# Patient Record
Sex: Male | Born: 1995
Health system: Southern US, Community
[De-identification: ages and names within clinical notes are randomized; demographics above are authoritative.]

## PROBLEM LIST (undated history)

## (undated) DIAGNOSIS — Z973 Presence of spectacles and contact lenses: Secondary | ICD-10-CM

## (undated) DIAGNOSIS — S46119A Strain of muscle, fascia and tendon of long head of biceps, unspecified arm, initial encounter: Secondary | ICD-10-CM

## (undated) DIAGNOSIS — J302 Other seasonal allergic rhinitis: Secondary | ICD-10-CM

## (undated) DIAGNOSIS — Z8489 Family history of other specified conditions: Secondary | ICD-10-CM

## (undated) DIAGNOSIS — R112 Nausea with vomiting, unspecified: Secondary | ICD-10-CM

## (undated) DIAGNOSIS — Z9889 Other specified postprocedural states: Secondary | ICD-10-CM

## (undated) HISTORY — DX: Other seasonal allergic rhinitis: J30.2

## (undated) HISTORY — PX: ANTERIOR CRUCIATE LIGAMENT REPAIR: SHX115

---
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
## 2015-05-09 ENCOUNTER — Ambulatory Visit (INDEPENDENT_AMBULATORY_CARE_PROVIDER_SITE_OTHER): Payer: BLUE CROSS/BLUE SHIELD

## 2015-05-09 ENCOUNTER — Ambulatory Visit (INDEPENDENT_AMBULATORY_CARE_PROVIDER_SITE_OTHER): Payer: BLUE CROSS/BLUE SHIELD | Admitting: Physician Assistant

## 2015-05-09 VITALS — BP 106/68 | HR 64 | Temp 97.6°F | Resp 16 | Ht 68.0 in | Wt 165.0 lb

## 2015-05-09 DIAGNOSIS — J209 Acute bronchitis, unspecified: Secondary | ICD-10-CM

## 2015-05-09 DIAGNOSIS — R0989 Other specified symptoms and signs involving the circulatory and respiratory systems: Secondary | ICD-10-CM

## 2015-05-09 MED ORDER — BENZONATATE 100 MG PO CAPS: 100.0000 mg | ORAL_CAPSULE | Freq: Three times a day (TID) | ORAL | Status: DC | PRN

## 2015-05-09 MED ORDER — ALBUTEROL SULFATE HFA 108 (90 BASE) MCG/ACT IN AERS: 2.0000 | INHALATION_SPRAY | RESPIRATORY_TRACT | Status: DC | PRN

## 2015-05-09 MED ORDER — AZITHROMYCIN 250 MG PO TABS: ORAL_TABLET | ORAL | Status: DC

## 2015-05-09 MED ORDER — HYDROCOD POLST-CPM POLST ER 10-8 MG/5ML PO SUER
5.0000 mL | Freq: Every evening | ORAL | Status: DC | PRN
Start: 2015-05-09 — End: 2016-07-05

## 2015-05-09 NOTE — Patient Instructions (Signed)

## 2015-05-09 NOTE — Progress Notes (Signed)
   Subjective:    Patient ID: Rick Dixon, male    DOB: 11/03/1995, 19 y.o.   MRN: [id number]  HPI Patient presents with father for cough that has been present for the past 2 weeks. Cough is productive and has gotten bad as of yesterday. Could not sleep last night and is coughing to the point of gagging and causing some chest pain. Additionally endorses nausea, decreased appetite, and sore throat. Denies wheezing, fever, vomiting, SOB, HA, rhinorrhea, and congestion. Multiple sick contacts as he lives in a dorm. NKDA.   Review of Systems As noted above.    Objective:   Physical Exam  Constitutional: He is oriented to person, place, and time. He appears well-developed and well-nourished. No distress.  Blood pressure 106/68, pulse 64, temperature 97.6 F (36.4 C), resp. rate 16, height 5\' 8"  (1.727 m), weight 165 lb (74.844 kg), SpO2 99 %.  HENT:  Head: Normocephalic and atraumatic.  Right Ear: Tympanic membrane, external ear and ear canal normal.  Left Ear: Tympanic membrane and ear canal normal.  Nose: Rhinorrhea present. No mucosal edema. Right sinus exhibits no maxillary sinus tenderness and no frontal sinus tenderness. Left sinus exhibits no maxillary sinus tenderness and no frontal sinus tenderness.  Mouth/Throat: Uvula is midline and mucous membranes are normal. Posterior oropharyngeal erythema present. No oropharyngeal exudate or posterior oropharyngeal edema.  Eyes: Conjunctivae are normal. Pupils are equal, round, and reactive to light. Right eye exhibits no discharge. Left eye exhibits no discharge. No scleral icterus.  Neck: Normal range of motion. Neck supple. No thyromegaly present.  Cardiovascular: Normal rate, regular rhythm and normal heart sounds.  Exam reveals no gallop and no friction rub.   No murmur heard. Pulmonary/Chest: Effort normal and breath sounds normal. No respiratory distress. He has no wheezes. He has no rales. He exhibits no tenderness.  Abdominal: Soft.  Bowel sounds are normal. He exhibits no distension and no mass. There is no tenderness. There is no rebound and no guarding.  Lymphadenopathy:    He has no cervical adenopathy.  Neurological: He is alert and oriented to person, place, and time.  Skin: Skin is warm and dry. No rash noted. He is not diaphoretic. No erythema. No pallor.   [facility] reading (PRIMARY) by  Dr. Neva SeatGreene. Increased hilar markings.     Assessment & Plan:  1. Rhonchi - DG Chest 2 View; Future  2. Acute bronchitis, unspecified organism Increase water intake. Plenty of rest.  - albuterol (PROVENTIL HFA;VENTOLIN HFA) 108 (90 BASE) MCG/ACT inhaler; Inhale 2 puffs into the lungs every 4 (four) hours as needed for wheezing or shortness of breath (cough, shortness of breath or wheezing.).  Dispense: 1 Inhaler; Refill: 1 - benzonatate (TESSALON) 100 MG capsule; Take 1-2 capsules (100-200 mg total) by mouth 3 (three) times daily as needed for cough.  Dispense: 40 capsule; Refill: 0 - chlorpheniramine-HYDROcodone (TUSSIONEX PENNKINETIC ER) 10-8 MG/5ML SUER; Take 5 mLs by mouth at bedtime as needed for cough.  Dispense: 75 mL; Refill: 0 - azithromycin (ZITHROMAX) 250 MG tablet; Take 2 tabs PO x 1 dose, then 1 tab PO QD x 4 days  Dispense: 6 tablet; Refill: 0   Eryca Bolte PA-C  [facility] 05/09/2015 2:47 PM

## 2016-02-08 DIAGNOSIS — H1013 Acute atopic conjunctivitis, bilateral: Secondary | ICD-10-CM | POA: Diagnosis not present

## 2016-04-29 DIAGNOSIS — H52223 Regular astigmatism, bilateral: Secondary | ICD-10-CM | POA: Diagnosis not present

## 2016-04-29 DIAGNOSIS — H5213 Myopia, bilateral: Secondary | ICD-10-CM | POA: Diagnosis not present

## 2016-07-04 DIAGNOSIS — J029 Acute pharyngitis, unspecified: Secondary | ICD-10-CM | POA: Diagnosis not present

## 2016-07-04 DIAGNOSIS — J039 Acute tonsillitis, unspecified: Secondary | ICD-10-CM | POA: Diagnosis not present

## 2016-07-05 ENCOUNTER — Ambulatory Visit (INDEPENDENT_AMBULATORY_CARE_PROVIDER_SITE_OTHER): Payer: BLUE CROSS/BLUE SHIELD | Admitting: Family Medicine

## 2016-07-05 VITALS — BP 100/66 | HR 122 | Temp 101.4°F | Resp 18 | Ht 68.0 in | Wt 161.0 lb

## 2016-07-05 DIAGNOSIS — R5081 Fever presenting with conditions classified elsewhere: Secondary | ICD-10-CM | POA: Diagnosis not present

## 2016-07-05 DIAGNOSIS — J029 Acute pharyngitis, unspecified: Secondary | ICD-10-CM

## 2016-07-05 DIAGNOSIS — R1012 Left upper quadrant pain: Secondary | ICD-10-CM | POA: Diagnosis not present

## 2016-07-05 DIAGNOSIS — R1011 Right upper quadrant pain: Secondary | ICD-10-CM

## 2016-07-05 DIAGNOSIS — J039 Acute tonsillitis, unspecified: Secondary | ICD-10-CM | POA: Diagnosis not present

## 2016-07-05 DIAGNOSIS — Z114 Encounter for screening for human immunodeficiency virus [HIV]: Secondary | ICD-10-CM

## 2016-07-05 DIAGNOSIS — E869 Volume depletion, unspecified: Secondary | ICD-10-CM

## 2016-07-05 DIAGNOSIS — B370 Candidal stomatitis: Secondary | ICD-10-CM

## 2016-07-05 DIAGNOSIS — B2799 Infectious mononucleosis, unspecified with other complication: Secondary | ICD-10-CM | POA: Diagnosis not present

## 2016-07-05 DIAGNOSIS — R638 Other symptoms and signs concerning food and fluid intake: Secondary | ICD-10-CM | POA: Diagnosis not present

## 2016-07-05 LAB — POCT URINALYSIS DIP (MANUAL ENTRY)
Glucose, UA: NEGATIVE
Leukocytes, UA: NEGATIVE
NITRITE UA: NEGATIVE
PH UA: 6
RBC UA: NEGATIVE
SPEC GRAV UA: 1.02
Urobilinogen, UA: 4

## 2016-07-05 LAB — POCT CBC
Granulocyte percent: 53.2 %G (ref 37–80)
HEMATOCRIT: 42.7 % — AB (ref 43.5–53.7)
HEMOGLOBIN: 15.1 g/dL (ref 14.1–18.1)
LYMPH, POC: 4.2 — AB (ref 0.6–3.4)
MCH: 29.5 pg (ref 27–31.2)
MCHC: 35.2 g/dL (ref 31.8–35.4)
MCV: 83.7 fL (ref 80–97)
MID (cbc): 1.1 — AB (ref 0–0.9)
MPV: 7.9 fL (ref 0–99.8)
POC GRANULOCYTE: 6 (ref 2–6.9)
POC LYMPH PERCENT: 37.1 %L (ref 10–50)
POC MID %: 9.7 %M (ref 0–12)
Platelet Count, POC: 165 10*3/uL (ref 142–424)
RBC: 5.1 M/uL (ref 4.69–6.13)
RDW, POC: 12.4 %
WBC: 11.3 10*3/uL — AB (ref 4.6–10.2)

## 2016-07-05 LAB — POCT RAPID STREP A (OFFICE): RAPID STREP A SCREEN: NEGATIVE

## 2016-07-05 LAB — POCT SKIN KOH: Skin KOH, POC: NEGATIVE

## 2016-07-05 MED ORDER — CLINDAMYCIN HCL 300 MG PO CAPS: 300.0000 mg | ORAL_CAPSULE | Freq: Three times a day (TID) | ORAL | 0 refills | Status: DC

## 2016-07-05 MED ORDER — HYDROCODONE-ACETAMINOPHEN 5-325 MG PO TABS: 1.0000 | ORAL_TABLET | Freq: Four times a day (QID) | ORAL | 0 refills | Status: DC | PRN

## 2016-07-05 MED ORDER — MAGIC MOUTHWASH W/LIDOCAINE: 5.0000 mL | Freq: Four times a day (QID) | ORAL | 0 refills | Status: DC | PRN

## 2016-07-05 MED ORDER — PREDNISONE 20 MG PO TABS: 40.0000 mg | ORAL_TABLET | Freq: Every day | ORAL | 0 refills | Status: DC

## 2016-07-05 MED ORDER — IBUPROFEN 200 MG PO TABS
400.0000 mg | ORAL_TABLET | Freq: Once | ORAL | Status: AC
  Administered 2016-07-05: 400 mg via ORAL

## 2016-07-05 MED ORDER — CLOTRIMAZOLE 10 MG MT TROC: 10.0000 mg | Freq: Every day | OROMUCOSAL | 0 refills | Status: DC

## 2016-07-05 NOTE — Progress Notes (Signed)
Iv 1000ccns started in right forearm 20 g  IV D/C 1320 W/O INCIDENT

## 2016-07-05 NOTE — Progress Notes (Signed)
Subjective:  By signing my name below, I, Stann Ore, attest that this documentation has been prepared under the direction and in the presence of Meredith Staggers, MD. Electronically Signed: Stann Ore, Scribe. 07/05/2016 , 10:33 AM .  Patient was seen in Room 7 .   Patient ID: Rick Dixon, male    DOB: 07-31-1995, 20 y.o.   MRN: [id number] Chief Complaint  Patient presents with  . Sore Throat    poss mono   HPI Rick Dixon is a 20 y.o. male  Patient was diagnosed for mono about 10 days~14 days ago, while at school. Initial testing was negative, but longer exam was positive. He had symptoms including feeling really fatigue, muscle aches, night sweat and abdominal pain. He had EBV VCA IGM that was positive. He had CBC showing platelets that were down to 99 but increased to 113 around Dec 10th. He had increased lymphocytes based on email from provider at school.   His symptoms started to improve about 3 days ago. But, then he noticed sore throat with worsening abdominal pain. He was seen at [facility] and had strep test done, which was negative. He was diagnosed with tonsillitis and instructed to follow up with a physician. He hasn't been able to eat or drink much in the past 2~3 days. He describes his sore throat like "it's closing up on him". He also noticed white patches over his lips and teeth this morning.   While at school, he was instructed to start a strict routine of ibuprofen and advil about 3 pills every 4~6 hours. He took tylenol last night and zpak 2nd dose today. He notes abdominal pain more last night. He denies any recent injury to his spleen. He denies lightheadedness or dizziness.   He attends [employer].   Spoke with patient with parent outside of room He denies STI testing done in the past. He's been sexually active with 1 partner and used condom for contraception.   Over 40 minutes of face-to-face care, with repeat exam.   Patient Active  Problem List   Diagnosis Date Noted  . Fracture of rib of right side with delayed healing 07/31/2013   Past Medical History:  Diagnosis Date  . History of seasonal allergies    History reviewed. No pertinent surgical history. No Known Allergies Prior to Admission medications   Medication Sig Start Date End Date Taking? Authorizing Provider  acetaminophen (TYLENOL) 325 MG tablet Take 650 mg by mouth every 6 (six) hours as needed.   Yes Historical Provider, MD  azithromycin (ZITHROMAX) 250 MG tablet Take 2 tabs PO x 1 dose, then 1 tab PO QD x 4 days 05/09/15  Yes Tishira R Brewington, PA-C  ibuprofen (ADVIL,MOTRIN) 200 MG tablet Take 200 mg by mouth every 6 (six) hours as needed.   Yes Historical Provider, MD   Social History   Social History  . Marital status: Single    Spouse name: N/A  . Number of children: N/A  . Years of education: N/A   Occupational History  . Not on file.   Social History Main Topics  . Smoking status: Never Smoker  . Smokeless tobacco: Never Used  . Alcohol use No  . Drug use: No  . Sexual activity: No   Other Topics Concern  . Not on file   Social History Narrative  . No narrative on file   Review of Systems  Constitutional: Positive for appetite change, diaphoresis, fatigue and fever. Negative for chills  and unexpected weight change.  HENT: Positive for sore throat, trouble swallowing and voice change.   Eyes: Negative for visual disturbance.  Respiratory: Negative for cough, chest tightness and shortness of breath.   Cardiovascular: Negative for chest pain, palpitations and leg swelling.  Gastrointestinal: Positive for abdominal pain. Negative for blood in stool.  Neurological: Negative for dizziness, light-headedness and headaches.       Objective:   Physical Exam  Constitutional: He is oriented to person, place, and time. He appears well-developed and well-nourished. No distress.  HENT:  Head: Normocephalic and atraumatic.    Mouth/Throat: Mucous membranes are normal.  White patches over top and bottom lip, as well as coating of tongue; 2+ tonsils with gray white exudate over right tonsil, prominence on pre tonsillar on right with possible left sided shift Right ear canal has a few black specks at external canal, no erythema or edema; TM pearly gray Left ear canal is clear  Eyes: EOM are normal. Pupils are equal, round, and reactive to light.  Neck: Neck supple.  Cardiovascular: Regular rhythm.  Tachycardia present.   Pulmonary/Chest: Effort normal. No respiratory distress.  Abdominal: Bowel sounds are normal. There is tenderness in the right upper quadrant and left upper quadrant. There is no rebound and no guarding.  Slight discomfort with heel-jar on left  Genitourinary:  Genitourinary Comments: Tender along inguinal nodes, but not significantly enlarged  Musculoskeletal: Normal range of motion.  Lymphadenopathy:       Head (left side): Occipital adenopathy present.    He has no axillary adenopathy.       Right: No epitrochlear adenopathy present.       Left: No epitrochlear adenopathy present.  Multiple enlarged AC nodes  Neurological: He is alert and oriented to person, place, and time.  Skin: Skin is warm and dry. No rash noted.  Normal turgor  Psychiatric: He has a normal mood and affect. His behavior is normal.  Nursing note and vitals reviewed.   Vitals:   07/05/16 0950  BP: 100/66  Pulse: (!) 122  Resp: 18  Temp: (!) 101.4 F (38.6 C)  TempSrc: Oral  SpO2: 99%  Weight: 161 lb (73 kg)  Height: 5\' 8"  (1.727 m)   Results for orders placed or performed in visit on 07/05/16  POCT CBC  Result Value Ref Range   WBC 11.3 (A) 4.6 - 10.2 K/uL   Lymph, poc 4.2 (A) 0.6 - 3.4   POC LYMPH PERCENT 37.1 10 - 50 %L   MID (cbc) 1.1 (A) 0 - 0.9   POC MID % 9.7 0 - 12 %M   POC Granulocyte 6.0 2 - 6.9   Granulocyte percent 53.2 37 - 80 %G   RBC 5.10 4.69 - 6.13 M/uL   Hemoglobin 15.1 14.1 - 18.1  g/dL   HCT, POC 16.1 (A) 09.6 - 53.7 %   MCV 83.7 80 - 97 fL   MCH, POC 29.5 27 - 31.2 pg   MCHC 35.2 31.8 - 35.4 g/dL   RDW, POC 04.5 %   Platelet Count, POC 165 142 - 424 K/uL   MPV 7.9 0 - 99.8 fL  POCT rapid strep A  Result Value Ref Range   Rapid Strep A Screen Negative Negative  POCT urinalysis dipstick  Result Value Ref Range   Color, UA yellow yellow   Clarity, UA clear clear   Glucose, UA negative negative   Bilirubin, UA small (A) negative   Ketones, POC UA >= (160) (  A) negative   Spec Grav, UA 1.020    Blood, UA negative negative   pH, UA 6.0    Protein Ur, POC trace (A) negative   Urobilinogen, UA 4.0    Nitrite, UA Negative Negative   Leukocytes, UA Negative Negative   Orthostatic VS for the past 24 hrs (Last 3 readings):  BP- Lying Pulse- Lying BP- Sitting Pulse- Sitting BP- Standing at 0 minutes Pulse- Standing at 0 minutes  07/05/16 1131 97/61 99 110/69 - 94/60 120  07/05/16 1046 129/75 106 123/64 104 94/59 125   [11:44 AM] After 1 L IV fluid, repeat orthostatics obtained. Still tachycardic. Has not had urine output since IV started. Second liter of normal saline started.     Assessment & Plan:    Rick Dixon is a 20 y.o. male Infectious mononucleosis, with other complication, infectious mononucleosis due to unspecified organism - Plan: predniSONE (DELTASONE) 20 MG tablet Volume depletion - Plan: Orthostatic vital signs, POCT urinalysis dipstick LUQ abdominal pain - Plan: POCT CBC RUQ abdominal pain - Plan: POCT CBC, Comprehensive metabolic panel Sore throat - Plan: POCT rapid strep A, Culture, Group A Strep Fever in other diseases - Plan: ibuprofen (ADVIL,MOTRIN) tablet 400 mg Acute tonsillitis, unspecified etiology - Plan: clindamycin (CLEOCIN) 300 MG capsule Decreased oral intake  - Infectious mononucleosis with possible secondary tonsillitis versus persistent tonsillar exudate and inflammation from mono. No definitive signs of peritonsillar abscess,  and second M.D. exam was performed. Changed from azithromycin to clindamycin for coverage of possible secondary tonsillitis without true PTA at this time. Follow-up in 24 hours.  -2 L normal saline given with improvement of volume depletion. Oral rehydration discussed with frequent sips of fluids especially water at home.  -Prednisone 40 mg daily for 5 days to help with tonsillar swelling to assist in oral intake.  -Hydrocodone if needed for throat pain, Magic mouthwash as needed   -Recheck 24 hours.  - Initial concern with abdominal pain and splenic rupture risk and mono. Pain improved after IV fluids, nontender on exam at end of visit. Decided to hold on imaging at this time.  Thrush - Plan: HIV antibody, POCT Skin KOH Screening for HIV (human immunodeficiency virus) - Plan: HIV antibody  -Scraping negative, but clinically appears to have thrush on tongue, and possibly on lips.  -Start Mycelex for thrush, check HIV antibody although no known risk factors.   Overnight ER precautions given.       Meds ordered this encounter  Medications  . ibuprofen (ADVIL,MOTRIN) 200 MG tablet    Sig: Take 200 mg by mouth every 6 (six) hours as needed.  Marland Kitchen. acetaminophen (TYLENOL) 325 MG tablet    Sig: Take 650 mg by mouth every 6 (six) hours as needed.  Marland Kitchen. ibuprofen (ADVIL,MOTRIN) tablet 400 mg  . clindamycin (CLEOCIN) 300 MG capsule    Sig: Take 1 capsule (300 mg total) by mouth 3 (three) times daily.    Dispense:  30 capsule    Refill:  0  . predniSONE (DELTASONE) 20 MG tablet    Sig: Take 2 tablets (40 mg total) by mouth daily with breakfast.    Dispense:  10 tablet    Refill:  0  . clotrimazole (MYCELEX) 10 MG troche    Sig: Take 1 tablet (10 mg total) by mouth 5 (five) times daily.    Dispense:  70 tablet    Refill:  0  . HYDROcodone-acetaminophen (NORCO/VICODIN) 5-325 MG tablet    Sig: Take 1 tablet  by mouth every 6 (six) hours as needed for moderate pain.    Dispense:  15 tablet     Refill:  0  . magic mouthwash w/lidocaine SOLN    Sig: Take 5 mLs by mouth 4 (four) times daily as needed for mouth pain.    Dispense:  120 mL    Refill:  0    Ok to substitute ingredients per pharmacy usual "magic mouthwash" prep.   Patient Instructions    Stop azithromycin and start clindamycin. Start prednisone 2 pills per day for the next 5 days. If any return of abdominal pain or other worsening symptoms tonight, proceed to the emergency room. Otherwise follow-up with me tomorrow morning for recheck. Small sips of fluids frequently tonight.  Return to the clinic or go to the nearest emergency room if any of your symptoms worsen or new symptoms occur.   Infectious Mononucleosis Infectious mononucleosis is a viral infection. It is often referred to as "mono." It causes symptoms that affect various areas of the body, including the throat, upper air passages, and lymph glands. The liver or spleen may also be affected. The virus spreads from person to person (is contagious) through close contact. The illness is usually not serious, and it typically goes away in 2-4 weeks without treatment. In rare cases, symptoms can be more severe and last longer, sometimes up to several months. What are the causes? This condition is commonly caused by the Epstein-Barr virus. This virus spreads through:  Contact with an infected person's saliva or other bodily fluids, often through:  Kissing.  Sexual contact.  Coughing.  Sneezing.  Sharing utensils or drinking glasses that were recently used by an infected person.  Blood transfusions.  Organ transplantation. What increases the risk? You are more likely to develop this condition if:  You are 9615-20 years old. What are the signs or symptoms? Symptoms of this condition usually appear 4-6 weeks after infection. Symptoms may develop slowly and occur at different times. Common symptoms include:  Sore throat.  Headache.  Extreme  fatigue.  Muscle aches.  Swollen glands.  Fever.  Poor appetite.  Rash. Other symptoms include:  Enlarged liver or spleen.  Nausea.  Abdominal pain. How is this diagnosed? This condition may be diagnosed based on:  Your medical history.  Your symptoms.  A physical exam.  Blood tests to confirm the diagnosis. How is this treated? There is no cure for this condition. Infectious mononucleosis usually goes away on its own with time. Treatment can help relieve symptoms and may include:  Taking medicines to relieve pain and fever.  Drinking plenty of fluids.  Getting a lot of rest.  Medicine (corticosteroids)to reduce swelling. This may be used if swelling in the throat causes breathing or swallowing problems. In some severe cases, treatment has to be given in a hospital. Follow these instructions at home: Medicines  Take over-the-counter and prescription medicines only as told by your health care provider.  Do not take ampicillin or amoxicillin. This may cause a rash.  If you are under 18, do not take aspirin because of the association with Reye syndrome. Activity  Rest as needed.  Do not participate in any of the following activities until your health care provider approves:  Contact sports. You may need to wait at least a month before participating in sports.  Exercise that requires a lot of energy.  Heavy lifting.  Gradually resume your normal activities after your fever is gone, or when your health care  provider tells you that you can. Be sure to rest when you get tired. General instructions  Avoid kissing or sharing utensils or drinking glasses until your health care provider tells you that you are no longer contagious.  Drink enough fluid to keep your urine clear or pale yellow.  Do not drink alcohol.  If you have a sore throat:  Gargle with a salt-water mixture 3-4 times a day or as needed. To make a salt-water mixture, completely dissolve -1  tsp of salt in 1 cup of warm water.  Eat soft foods. Cold foods such as ice cream or frozen ice pops can soothe a sore throat.  Try sucking on hard candy.  Wash your hands often with soap and water to avoid spreading the infection. If soap and water are not available, use hand sanitizer. How is this prevented?  Avoid contact with people who are infected with mononucleosis. An infected person may not always appear ill, but he or she can still spread the virus.  Avoid sharing utensils, drinking glasses, or toothbrushes.  Wash your hands frequently with soap and water. If soap and water are not available, use hand sanitizer.  Use the inside of your elbow to cover your mouth when coughing or sneezing. Contact a health care provider if:  Your fever is not gone after 10 days.  You have swollen lymph nodes that are not back to normal after 4 weeks.  Your activity level is not back to normal after 2 months.  Your skin or the white parts of your eyes turn yellow (jaundice).  You have constipation. This may mean that you have:  Fewer bowel movements in a week than normal.  Difficulty having a bowel movement.  Stools that are dry, hard, or larger than normal. Get help right away if:  You have severe pain in your abdomen or shoulder.  You are drooling.  You have trouble swallowing.  You have trouble breathing.  You develop a stiff neck.  You develop a severe headache.  You cannot stop vomiting.  You have jerky movements that you cannot control (seizures).  You are confused.  You have trouble with balance.  Your nose or gums begin to bleed.  You have signs of dehydration. These may include:  Weakness.  Sunken eyes.  Pale skin.  Dry mouth.  Rapid breathing or pulse. Summary  Infectious mononucleosis, or "mono," is an infection caused by the Epstein-Barr virus.  The virus that causes this condition is spread through bodily fluids. The virus is most commonly  spread by kissing or sharing drinks or utensils with an infected person.  You are more likely to develop this infection if you are 59-22 years old.  Symptoms of this condition can include sore throat, headache, fever, swollen glands, muscle aches, extreme fatigue, and swollen liver or spleen.  There is no cure for this condition. The goal of treatment is to help relieve symptoms. Treatment may include drinking plenty of water, getting a lot of rest, and taking pain relievers. This information is not intended to replace advice given to you by your health care provider. Make sure you discuss any questions you have with your health care provider. Document Released: 07/01/2000 Document Revised: 03/22/2016 Document Reviewed: 03/22/2016 Elsevier Interactive Patient Education  2017 [employer].     IF you received an x-ray today, you will receive an invoice from [facility]. Please contact [facility] at [phone] with questions or concerns regarding your invoice.   IF you received  labwork today, you will receive an invoice from [employer]. Please contact [facility] at [phone] with questions or concerns regarding your invoice.   Our billing staff will not be able to assist you with questions regarding bills from these companies.  You will be contacted with the lab results as soon as they are available. The fastest way to get your results is to activate your My Chart account. Instructions are located on the last page of this paperwork. If you have not heard from [place] regarding the results in 2 weeks, please contact this office.       I personally performed the services described in this documentation, which was scribed in my presence. The recorded information has been reviewed and considered, and addended by me as needed.   Signed,   Meredith Staggers, MD [facility].  07/06/16 9:07 AM

## 2016-07-05 NOTE — Patient Instructions (Addendum)
Stop azithromycin and start clindamycin. Start prednisone 2 pills per day for the next 5 days. If any return of abdominal pain or other worsening symptoms tonight, proceed to the emergency room. Otherwise follow-up with me tomorrow morning for recheck. Small sips of fluids frequently tonight.  Return to the clinic or go to the nearest emergency room if any of your symptoms worsen or new symptoms occur.   Infectious Mononucleosis Infectious mononucleosis is a viral infection. It is often referred to as "mono." It causes symptoms that affect various areas of the body, including the throat, upper air passages, and lymph glands. The liver or spleen may also be affected. The virus spreads from person to person (is contagious) through close contact. The illness is usually not serious, and it typically goes away in 2-4 weeks without treatment. In rare cases, symptoms can be more severe and last longer, sometimes up to several months. What are the causes? This condition is commonly caused by the Epstein-Barr virus. This virus spreads through:  Contact with an infected person's saliva or other bodily fluids, often through:  Kissing.  Sexual contact.  Coughing.  Sneezing.  Sharing utensils or drinking glasses that were recently used by an infected person.  Blood transfusions.  Organ transplantation. What increases the risk? You are more likely to develop this condition if:  You are 2715-20 years old. What are the signs or symptoms? Symptoms of this condition usually appear 4-6 weeks after infection. Symptoms may develop slowly and occur at different times. Common symptoms include:  Sore throat.  Headache.  Extreme fatigue.  Muscle aches.  Swollen glands.  Fever.  Poor appetite.  Rash. Other symptoms include:  Enlarged liver or spleen.  Nausea.  Abdominal pain. How is this diagnosed? This condition may be diagnosed based on:  Your medical history.  Your symptoms.  A  physical exam.  Blood tests to confirm the diagnosis. How is this treated? There is no cure for this condition. Infectious mononucleosis usually goes away on its own with time. Treatment can help relieve symptoms and may include:  Taking medicines to relieve pain and fever.  Drinking plenty of fluids.  Getting a lot of rest.  Medicine (corticosteroids)to reduce swelling. This may be used if swelling in the throat causes breathing or swallowing problems. In some severe cases, treatment has to be given in a hospital. Follow these instructions at home: Medicines  Take over-the-counter and prescription medicines only as told by your health care provider.  Do not take ampicillin or amoxicillin. This may cause a rash.  If you are under 18, do not take aspirin because of the association with Reye syndrome. Activity  Rest as needed.  Do not participate in any of the following activities until your health care provider approves:  Contact sports. You may need to wait at least a month before participating in sports.  Exercise that requires a lot of energy.  Heavy lifting.  Gradually resume your normal activities after your fever is gone, or when your health care provider tells you that you can. Be sure to rest when you get tired. General instructions  Avoid kissing or sharing utensils or drinking glasses until your health care provider tells you that you are no longer contagious.  Drink enough fluid to keep your urine clear or pale yellow.  Do not drink alcohol.  If you have a sore throat:  Gargle with a salt-water mixture 3-4 times a day or as needed. To make a salt-water mixture, completely dissolve -  1 tsp of salt in 1 cup of warm water.  Eat soft foods. Cold foods such as ice cream or frozen ice pops can soothe a sore throat.  Try sucking on hard candy.  Wash your hands often with soap and water to avoid spreading the infection. If soap and water are not available, use  hand sanitizer. How is this prevented?  Avoid contact with people who are infected with mononucleosis. An infected person may not always appear ill, but he or she can still spread the virus.  Avoid sharing utensils, drinking glasses, or toothbrushes.  Wash your hands frequently with soap and water. If soap and water are not available, use hand sanitizer.  Use the inside of your elbow to cover your mouth when coughing or sneezing. Contact a health care provider if:  Your fever is not gone after 10 days.  You have swollen lymph nodes that are not back to normal after 4 weeks.  Your activity level is not back to normal after 2 months.  Your skin or the white parts of your eyes turn yellow (jaundice).  You have constipation. This may mean that you have:  Fewer bowel movements in a week than normal.  Difficulty having a bowel movement.  Stools that are dry, hard, or larger than normal. Get help right away if:  You have severe pain in your abdomen or shoulder.  You are drooling.  You have trouble swallowing.  You have trouble breathing.  You develop a stiff neck.  You develop a severe headache.  You cannot stop vomiting.  You have jerky movements that you cannot control (seizures).  You are confused.  You have trouble with balance.  Your nose or gums begin to bleed.  You have signs of dehydration. These may include:  Weakness.  Sunken eyes.  Pale skin.  Dry mouth.  Rapid breathing or pulse. Summary  Infectious mononucleosis, or "mono," is an infection caused by the Epstein-Barr virus.  The virus that causes this condition is spread through bodily fluids. The virus is most commonly spread by kissing or sharing drinks or utensils with an infected person.  You are more likely to develop this infection if you are 8815-20 years old.  Symptoms of this condition can include sore throat, headache, fever, swollen glands, muscle aches, extreme fatigue, and swollen  liver or spleen.  There is no cure for this condition. The goal of treatment is to help relieve symptoms. Treatment may include drinking plenty of water, getting a lot of rest, and taking pain relievers. This information is not intended to replace advice given to you by your health care provider. Make sure you discuss any questions you have with your health care provider. Document Released: 07/01/2000 Document Revised: 03/22/2016 Document Reviewed: 03/22/2016 Elsevier Interactive Patient Education  2017 [employer]     IF you received an x-ray today, you will receive an invoice from [facility]. Please contact [facility] at [phone] with questions or concerns regarding your invoice.   IF you received labwork today, you will receive an invoice from [facility]. Please contact [facility] at [phone] with questions or concerns regarding your invoice.   Our billing staff will not be able to assist you with questions regarding bills from these companies.  You will be contacted with the lab results as soon as they are available. The fastest way to get your results is to activate your My Chart account. Instructions are located on the last page of this paperwork. If you have  not heard from [place] regarding the results in 2 weeks, please contact this office.

## 2016-07-06 ENCOUNTER — Ambulatory Visit (INDEPENDENT_AMBULATORY_CARE_PROVIDER_SITE_OTHER): Payer: BLUE CROSS/BLUE SHIELD | Admitting: Family Medicine

## 2016-07-06 VITALS — BP 118/70 | HR 90 | Temp 97.6°F | Resp 16 | Ht 68.0 in | Wt 161.0 lb

## 2016-07-06 DIAGNOSIS — J029 Acute pharyngitis, unspecified: Secondary | ICD-10-CM

## 2016-07-06 DIAGNOSIS — B2709 Gammaherpesviral mononucleosis with other complications: Secondary | ICD-10-CM

## 2016-07-06 DIAGNOSIS — R7989 Other specified abnormal findings of blood chemistry: Secondary | ICD-10-CM | POA: Diagnosis not present

## 2016-07-06 DIAGNOSIS — R945 Abnormal results of liver function studies: Secondary | ICD-10-CM

## 2016-07-06 DIAGNOSIS — J039 Acute tonsillitis, unspecified: Secondary | ICD-10-CM

## 2016-07-06 DIAGNOSIS — B370 Candidal stomatitis: Secondary | ICD-10-CM

## 2016-07-06 LAB — POCT CBC
GRANULOCYTE PERCENT: 57.8 % (ref 37–80)
HCT, POC: 38.6 % — AB (ref 43.5–53.7)
HEMOGLOBIN: 13.8 g/dL — AB (ref 14.1–18.1)
Lymph, poc: 3.6 — AB (ref 0.6–3.4)
MCH: 29.4 pg (ref 27–31.2)
MCHC: 35.7 g/dL — AB (ref 31.8–35.4)
MCV: 82.4 fL (ref 80–97)
MID (CBC): 0.8 (ref 0–0.9)
MPV: 8.1 fL (ref 0–99.8)
PLATELET COUNT, POC: 150 10*3/uL (ref 142–424)
POC Granulocyte: 6 (ref 2–6.9)
POC LYMPH PERCENT: 34.9 %L (ref 10–50)
POC MID %: 7.3 % (ref 0–12)
RBC: 4.69 M/uL (ref 4.69–6.13)
RDW, POC: 12.5 %
WBC: 10.3 10*3/uL — AB (ref 4.6–10.2)

## 2016-07-06 LAB — COMPREHENSIVE METABOLIC PANEL
ALBUMIN: 4.5 g/dL (ref 3.5–5.5)
ALK PHOS: 147 IU/L — AB (ref 39–117)
ALT: 481 IU/L — ABNORMAL HIGH (ref 0–44)
AST: 125 IU/L — ABNORMAL HIGH (ref 0–40)
Albumin/Globulin Ratio: 1.3 (ref 1.2–2.2)
BUN / CREAT RATIO: 13 (ref 9–20)
BUN: 14 mg/dL (ref 6–20)
Bilirubin Total: 1.3 mg/dL — ABNORMAL HIGH (ref 0.0–1.2)
CALCIUM: 9.8 mg/dL (ref 8.7–10.2)
CO2: 20 mmol/L (ref 18–29)
CREATININE: 1.09 mg/dL (ref 0.76–1.27)
Chloride: 96 mmol/L (ref 96–106)
GFR calc Af Amer: 112 mL/min/{1.73_m2} (ref >59)
GFR, EST NON AFRICAN AMERICAN: 97 mL/min/{1.73_m2} (ref >59)
GLOBULIN, TOTAL: 3.5 g/dL (ref 1.5–4.5)
GLUCOSE: 109 mg/dL — AB (ref 65–99)
Potassium: 4.5 mmol/L (ref 3.5–5.2)
SODIUM: 139 mmol/L (ref 134–144)
TOTAL PROTEIN: 8 g/dL (ref 6.0–8.5)

## 2016-07-06 LAB — HIV ANTIBODY (ROUTINE TESTING W REFLEX): HIV SCREEN 4TH GENERATION: NONREACTIVE

## 2016-07-06 NOTE — Patient Instructions (Addendum)
Liver tests were elevated yesterday, and that is likely due to mono. Because your abdomen is nontender today, I think we can hold on imaging at this time. If any abdominal pain, lightheadedness,  dizziness, or other worsening, return here or the emergency room as you may need imaging of your abdomen at that time. Continue prednisone to help with swelling of tonsils, clindamycin for possible secondary infection in tonsils. Hydrocodone and or Magic mouthwash for throat pain. Continue ibuprofen 400-600 mg every 6 hours as needed. If white patches on tongue have resolved on Friday, we may be able to stop the medicine for thrush. Follow-up with Rick CoreBrittany Wiseman on Friday as planned, sooner if any worsening symptoms.  Infectious Mononucleosis Infectious mononucleosis is a viral infection. It is often referred to as "mono." It causes symptoms that affect various areas of the body, including the throat, upper air passages, and lymph glands. The liver or spleen may also be affected. The virus spreads from person to person (is contagious) through close contact. The illness is usually not serious, and it typically goes away in 2-4 weeks without treatment. In rare cases, symptoms can be more severe and last longer, sometimes up to several months. What are the causes? This condition is commonly caused by the Epstein-Barr virus. This virus spreads through:  Contact with an infected person's saliva or other bodily fluids, often through:  Kissing.  Sexual contact.  Coughing.  Sneezing.  Sharing utensils or drinking glasses that were recently used by an infected person.  Blood transfusions.  Organ transplantation. What increases the risk? You are more likely to develop this condition if:  You are 20-809 years old. What are the signs or symptoms? Symptoms of this condition usually appear 4-6 weeks after infection. Symptoms may develop slowly and occur at different times. Common symptoms include:  Sore  throat.  Headache.  Extreme fatigue.  Muscle aches.  Swollen glands.  Fever.  Poor appetite.  Rash. Other symptoms include:  Enlarged liver or spleen.  Nausea.  Abdominal pain. How is this diagnosed? This condition may be diagnosed based on:  Your medical history.  Your symptoms.  A physical exam.  Blood tests to confirm the diagnosis. How is this treated? There is no cure for this condition. Infectious mononucleosis usually goes away on its own with time. Treatment can help relieve symptoms and may include:  Taking medicines to relieve pain and fever.  Drinking plenty of fluids.  Getting a lot of rest.  Medicine (corticosteroids)to reduce swelling. This may be used if swelling in the throat causes breathing or swallowing problems. In some severe cases, treatment has to be given in a hospital. Follow these instructions at home: Medicines  Take over-the-counter and prescription medicines only as told by your health care provider.  Do not take ampicillin or amoxicillin. This may cause a rash.  If you are under 18, do not take aspirin because of the association with Reye syndrome. Activity  Rest as needed.  Do not participate in any of the following activities until your health care provider approves:  Contact sports. You may need to wait at least a month before participating in sports.  Exercise that requires a lot of energy.  Heavy lifting.  Gradually resume your normal activities after your fever is gone, or when your health care provider tells you that you can. Be sure to rest when you get tired. General instructions  Avoid kissing or sharing utensils or drinking glasses until your health care provider tells you  that you are no longer contagious.  Drink enough fluid to keep your urine clear or pale yellow.  Do not drink alcohol.  If you have a sore throat:  Gargle with a salt-water mixture 3-4 times a day or as needed. To make a salt-water  mixture, completely dissolve -1 tsp of salt in 1 cup of warm water.  Eat soft foods. Cold foods such as ice cream or frozen ice pops can soothe a sore throat.  Try sucking on hard candy.  Wash your hands often with soap and water to avoid spreading the infection. If soap and water are not available, use hand sanitizer. How is this prevented?  Avoid contact with people who are infected with mononucleosis. An infected person may not always appear ill, but he or she can still spread the virus.  Avoid sharing utensils, drinking glasses, or toothbrushes.  Wash your hands frequently with soap and water. If soap and water are not available, use hand sanitizer.  Use the inside of your elbow to cover your mouth when coughing or sneezing. Contact a health care provider if:  Your fever is not gone after 10 days.  You have swollen lymph nodes that are not back to normal after 4 weeks.  Your activity level is not back to normal after 2 months.  Your skin or the white parts of your eyes turn yellow (jaundice).  You have constipation. This may mean that you have:  Fewer bowel movements in a week than normal.  Difficulty having a bowel movement.  Stools that are dry, hard, or larger than normal. Get help right away if:  You have severe pain in your abdomen or shoulder.  You are drooling.  You have trouble swallowing.  You have trouble breathing.  You develop a stiff neck.  You develop a severe headache.  You cannot stop vomiting.  You have jerky movements that you cannot control (seizures).  You are confused.  You have trouble with balance.  Your nose or gums begin to bleed.  You have signs of dehydration. These may include:  Weakness.  Sunken eyes.  Pale skin.  Dry mouth.  Rapid breathing or pulse. Summary  Infectious mononucleosis, or "mono," is an infection caused by the Epstein-Barr virus.  The virus that causes this condition is spread through bodily  fluids. The virus is most commonly spread by kissing or sharing drinks or utensils with an infected person.  You are more likely to develop this infection if you are 20-20 years old  Symptoms of this condition can include sore throat, headache, fever, swollen glands, muscle aches, extreme fatigue, and swollen liver or spleen.  There is no cure for this condition. The goal of treatment is to help relieve symptoms. Treatment may include drinking plenty of water, getting a lot of rest, and taking pain relievers. This information is not intended to replace advice given to you by your health care provider. Make sure you discuss any questions you have with your health care provider. Document Released: 07/01/2000 Document Revised: 03/22/2016 Document Reviewed: 03/22/2016 Elsevier Interactive Patient Education  2017 [employer]    IF you received an x-ray today, you will receive an invoice from [facility]. Please contact [facility] at [phone] with questions or concerns regarding your invoice.   IF you received labwork today, you will receive an invoice from [facility]. Please contact [facility] at [phone] with questions or concerns regarding your invoice.   Our billing staff will not be able to assist you  with questions regarding bills from these companies.  You will be contacted with the lab results as soon as they are available. The fastest way to get your results is to activate your My Chart account. Instructions are located on the last page of this paperwork. If you have not heard from [place] regarding the results in 2 weeks, please contact this office.

## 2016-07-06 NOTE — Progress Notes (Signed)
Subjective:  By signing my name below, I, Raven Small, attest that this documentation has been prepared under the direction and in the presence of Meredith StaggersJeffrey Yoshiko Keleher, MD.  Electronically Signed: Andrew Auaven Small, ED Scribe. 07/06/2016. 8:51 AM.   Patient ID: Rick Dixon, male    DOB: 05/22/1996, 20 y.o.   MRN: [id number]  HPI Chief Complaint  Patient presents with  . Follow-up    Recheck     HPI Comments: Rick Dixon is a 20 y.o. male who presents to the [facility] for a follow up. See visit yesterday. [facility]. Initially had fatigue, night sweates, approximately 2 weeks ago the worsened symptoms of sore throat, difficulty swallowing and abdominal pain with fever 3-4 days ago. He was positive for EBV, VCA, IGM at outside clinic. When seen yesterday, he was volume depleted received 2L of normal saline with resolution of abdominal pain. Deferred imaging as less likely splenic rupture. Right greater than left tonsil was significantly enlarged but airway was maintained in clearing secretions. Strep testing was negative both here and other office, but concern on possible secondary tonsillitis. Change from zpack to clindamycin. No definitive signs of PTA.  He was also started on prednisone 20 mg qd due to tonsillar swelling and difficulty eating drinking. Hydrocodone and magic mouth wash provided for symptomatic care. He was started mycelex for possible thrush on tongue.  Since being seen yesterday, pt reports improved symptoms of fever, thrush and abdominal pain. He also notes feeling less fatigued, but has had a persistent sore throat. He has not developed new symptoms but does note 1 episode of loose stool suspected to be due to medication. He stopped taking tylenol and instead has been taking ibuprofen, twice since last visit. He's used magic mouth wash with temporary relief but found great relief with hydrocodone last night; he denies adverse  effects with medication.  He started new abx, twice yesterday and once this morning. Pt has been urinating but reports dark colored urine. He ate soup last night along with a milk shake and some french fries.     Patient Active Problem List   Diagnosis Date Noted  . Fracture of rib of right side with delayed healing 07/31/2013   Past Medical History:  Diagnosis Date  . History of seasonal allergies    No past surgical history on file. No Known Allergies Prior to Admission medications   Medication Sig Start Date End Date Taking? Authorizing Provider  acetaminophen (TYLENOL) 325 MG tablet Take 650 mg by mouth every 6 (six) hours as needed.    Historical Provider, MD  clindamycin (CLEOCIN) 300 MG capsule Take 1 capsule (300 mg total) by mouth 3 (three) times daily. 07/05/16   Shade FloodJeffrey R Scottie Stanish, MD  clotrimazole (MYCELEX) 10 MG troche Take 1 tablet (10 mg total) by mouth 5 (five) times daily. 07/05/16   Shade FloodJeffrey R Lauryl Seyer, MD  HYDROcodone-acetaminophen (NORCO/VICODIN) 5-325 MG tablet Take 1 tablet by mouth every 6 (six) hours as needed for moderate pain. 07/05/16   Shade FloodJeffrey R Jaleeah Slight, MD  ibuprofen (ADVIL,MOTRIN) 200 MG tablet Take 200 mg by mouth every 6 (six) hours as needed.    Historical Provider, MD  magic mouthwash w/lidocaine SOLN Take 5 mLs by mouth 4 (four) times daily as needed for mouth pain. 07/05/16   Shade FloodJeffrey R Mister Krahenbuhl, MD  predniSONE (DELTASONE) 20 MG tablet Take 2 tablets (40 mg total) by mouth daily with breakfast. 07/05/16   Shade FloodJeffrey R Oneka Parada,  MD   Social History   Social History  . Marital status: Single    Spouse name: N/A  . Number of children: N/A  . Years of education: N/A   Occupational History  . Not on file.   Social History Main Topics  . Smoking status: Never Smoker  . Smokeless tobacco: Never Used  . Alcohol use No  . Drug use: No  . Sexual activity: No   Other Topics Concern  . Not on file   Social History Narrative  . No narrative on file   Review of  Systems  Constitutional: Positive for fatigue. Negative for fever.  HENT: Positive for sore throat.   Gastrointestinal: Negative for abdominal pain and constipation.    Objective:   Physical Exam  Constitutional: He is oriented to person, place, and time. He appears well-developed and well-nourished. No distress.  HENT:  Head: Normocephalic and atraumatic.  Right Ear: Tympanic membrane, external ear and ear canal normal.  Left Ear: Tympanic membrane, external ear and ear canal normal.  Nose: No rhinorrhea.  Mouth/Throat: Oropharynx is clear and moist and mucous membranes are normal. No oropharyngeal exudate or posterior oropharyngeal erythema.  Still 2-3+ tonsil, right greater than left with exudate on right. No apparent uvula shift. Pre tonsillar area does not appear swollen. Small amount of white coating on his tongue  Eyes: Conjunctivae and EOM are normal. Pupils are equal, round, and reactive to light.  Neck: Neck supple.  Cardiovascular: Normal rate, regular rhythm, normal heart sounds and intact distal pulses.   No murmur heard. Pulmonary/Chest: Effort normal and breath sounds normal. He has no wheezes. He has no rhonchi. He has no rales.  Abdominal: Soft. Bowel sounds are normal. There is no tenderness.  Musculoskeletal: Normal range of motion.  Lymphadenopathy:    He has cervical adenopathy.    He has no axillary adenopathy.       Right: Supraclavicular adenopathy present. No epitrochlear adenopathy present.       Left: Supraclavicular adenopathy present. No epitrochlear adenopathy present.  Tender AC nodes bilaterally. Few paracervical nodes. Small lymph node on left occipital.   Neurological: He is alert and oriented to person, place, and time.  Skin: Skin is warm and dry. No rash noted.  Psychiatric: He has a normal mood and affect. His behavior is normal.  Nursing note and vitals reviewed.   Vitals:   07/06/16 0851  BP: 118/70  Pulse: 90  Resp: 16  Temp: 97.6 F  (36.4 C)  TempSrc: Oral  SpO2: 99%  Weight: 161 lb (73 kg)  Height: 5\' 8"  (1.727 m)     Results for orders placed or performed in visit on 07/05/16  Comprehensive metabolic panel  Result Value Ref Range   Glucose 109 (H) 65 - 99 mg/dL   BUN 14 6 - 20 mg/dL   Creatinine, Ser 1.61 0.76 - 1.27 mg/dL   GFR calc non Af Amer 97 >59 mL/min/1.73   GFR calc Af Amer 112 >59 mL/min/1.73   BUN/Creatinine Ratio 13 9 - 20   Sodium 139 134 - 144 mmol/L   Potassium 4.5 3.5 - 5.2 mmol/L   Chloride 96 96 - 106 mmol/L   CO2 20 18 - 29 mmol/L   Calcium 9.8 8.7 - 10.2 mg/dL   Total Protein 8.0 6.0 - 8.5 g/dL   Albumin 4.5 3.5 - 5.5 g/dL   Globulin, Total 3.5 1.5 - 4.5 g/dL   Albumin/Globulin Ratio 1.3 1.2 - 2.2   Bilirubin  Total 1.3 (H) 0.0 - 1.2 mg/dL   Alkaline Phosphatase 147 (H) 39 - 117 IU/L   AST 125 (H) 0 - 40 IU/L   ALT 481 (H) 0 - 44 IU/L  HIV antibody  Result Value Ref Range   HIV Screen 4th Generation wRfx Non Reactive Non Reactive  POCT CBC  Result Value Ref Range   WBC 11.3 (A) 4.6 - 10.2 K/uL   Lymph, poc 4.2 (A) 0.6 - 3.4   POC LYMPH PERCENT 37.1 10 - 50 %L   MID (cbc) 1.1 (A) 0 - 0.9   POC MID % 9.7 0 - 12 %M   POC Granulocyte 6.0 2 - 6.9   Granulocyte percent 53.2 37 - 80 %G   RBC 5.10 4.69 - 6.13 M/uL   Hemoglobin 15.1 14.1 - 18.1 g/dL   HCT, POC 96.0 (A) 45.4 - 53.7 %   MCV 83.7 80 - 97 fL   MCH, POC 29.5 27 - 31.2 pg   MCHC 35.2 31.8 - 35.4 g/dL   RDW, POC 09.8 %   Platelet Count, POC 165 142 - 424 K/uL   MPV 7.9 0 - 99.8 fL  POCT rapid strep A  Result Value Ref Range   Rapid Strep A Screen Negative Negative  POCT urinalysis dipstick  Result Value Ref Range   Color, UA yellow yellow   Clarity, UA clear clear   Glucose, UA negative negative   Bilirubin, UA small (A) negative   Ketones, POC UA >= (160) (A) negative   Spec Grav, UA 1.020    Blood, UA negative negative   pH, UA 6.0    Protein Ur, POC trace (A) negative   Urobilinogen, UA 4.0    Nitrite, UA  Negative Negative   Leukocytes, UA Negative Negative  POCT Skin KOH  Result Value Ref Range   Skin KOH, POC Negative    Results for orders placed or performed in visit on 07/06/16  POCT CBC  Result Value Ref Range   WBC 10.3 (A) 4.6 - 10.2 K/uL   Lymph, poc 3.6 (A) 0.6 - 3.4   POC LYMPH PERCENT 34.9 10 - 50 %L   MID (cbc) 0.8 0 - 0.9   POC MID % 7.3 0 - 12 %M   POC Granulocyte 6.0 2 - 6.9   Granulocyte percent 57.8 37 - 80 %G   RBC 4.69 4.69 - 6.13 M/uL   Hemoglobin 13.8 (A) 14.1 - 18.1 g/dL   HCT, POC 11.9 (A) 14.7 - 53.7 %   MCV 82.4 80 - 97 fL   MCH, POC 29.4 27 - 31.2 pg   MCHC 35.7 (A) 31.8 - 35.4 g/dL   RDW, POC 82.9 %   Platelet Count, POC 150 142 - 424 K/uL   MPV 8.1 0 - 99.8 fL    Assessment & Plan:   Ishaaq Penna is a 20 y.o. male Gammaherpesviral mononucleosis with other complications - Plan: POCT CBC  Sore throat - Plan: POCT CBC  Thrush  Elevated LFTs - Plan: Hepatic Function Panel, POCT CBC  Tonsillitis - Plan: POCT CBC  - Infectious mononucleosis with possible secondary tonsillitis. Volume depletion appears improved, tolerating oral rehydration therapy. Pain managed with hydrocodone, Magic mouthwash, and ibuprofen. Leukocytosis improved, abdomen soft nontender, and no recurrence of abdominal pain. Doubt spleen rupture but ER precautions given if worsening symptoms. Still white coating on tongue, possible thrush. Elevated LFTs likely due to mono, but will recheck today.  -Continue hydrocodone as needed  for pain, Magic mouthwash, ibuprofen, fluids/ORT.   -continue clindamycin for tonsilitis (SED), mycelex for thrush for now. If white patches resolve, may be able to discontinue Mycelex.   -relative rest, and recheck in 48 hours with [place] Wiseman. ER/RTC precautions sooner if worsening.    No orders of the defined types were placed in this encounter.  Patient Instructions    Liver tests were elevated yesterday, and that is likely due to mono. Because  your abdomen is nontender today, I think we can hold on imaging at this time. If any abdominal pain, lightheadedness,  dizziness, or other worsening, return here or the emergency room as you may need imaging of your abdomen at that time. Continue prednisone to help with swelling of tonsils, clindamycin for possible secondary infection in tonsils. Hydrocodone and or Magic mouthwash for throat pain. Continue ibuprofen 400-600 mg every 6 hours as needed. If white patches on tongue have resolved on Friday, we may be able to stop the medicine for thrush. Follow-up with Benjiman Core[place]ittany Wiseman on Friday as planned, sooner if any worsening symptoms.  Infectious Mononucleosis Infectious mononucleosis is a viral infection. It is often referred to as "mono." It causes symptoms that affect various areas of the body, including the throat, upper air passages, and lymph glands. The liver or spleen may also be affected. The virus spreads from person to person (is contagious) through close contact. The illness is usually not serious, and it typically goes away in 2-4 weeks without treatment. In rare cases, symptoms can be more severe and last longer, sometimes up to several months. What are the causes? This condition is commonly caused by the Epstein-Barr virus. This virus spreads through:  Contact with an infected person's saliva or other bodily fluids, often through:  Kissing.  Sexual contact.  Coughing.  Sneezing.  Sharing utensils or drinking glasses that were recently used by an infected person.  Blood transfusions.  Organ transplantation. What increases the risk? You are more likely to develop this condition if:  You are 5615-20 years old. What are the signs or symptoms? Symptoms of this condition usually appear 4-6 weeks after infection. Symptoms may develop slowly and occur at different times. Common symptoms include:  Sore throat.  Headache.  Extreme fatigue.  Muscle aches.  Swollen  glands.  Fever.  Poor appetite.  Rash. Other symptoms include:  Enlarged liver or spleen.  Nausea.  Abdominal pain. How is this diagnosed? This condition may be diagnosed based on:  Your medical history.  Your symptoms.  A physical exam.  Blood tests to confirm the diagnosis. How is this treated? There is no cure for this condition. Infectious mononucleosis usually goes away on its own with time. Treatment can help relieve symptoms and may include:  Taking medicines to relieve pain and fever.  Drinking plenty of fluids.  Getting a lot of rest.  Medicine (corticosteroids)to reduce swelling. This may be used if swelling in the throat causes breathing or swallowing problems. In some severe cases, treatment has to be given in a hospital. Follow these instructions at home: Medicines  Take over-the-counter and prescription medicines only as told by your health care provider.  Do not take ampicillin or amoxicillin. This may cause a rash.  If you are under 18, do not take aspirin because of the association with Reye syndrome. Activity  Rest as needed.  Do not participate in any of the following activities until your health care provider approves:  Contact sports. You may need to wait at  least a month before participating in sports.  Exercise that requires a lot of energy.  Heavy lifting.  Gradually resume your normal activities after your fever is gone, or when your health care provider tells you that you can. Be sure to rest when you get tired. General instructions  Avoid kissing or sharing utensils or drinking glasses until your health care provider tells you that you are no longer contagious.  Drink enough fluid to keep your urine clear or pale yellow.  Do not drink alcohol.  If you have a sore throat:  Gargle with a salt-water mixture 3-4 times a day or as needed. To make a salt-water mixture, completely dissolve -1 tsp of salt in 1 cup of warm  water.  Eat soft foods. Cold foods such as ice cream or frozen ice pops can soothe a sore throat.  Try sucking on hard candy.  Wash your hands often with soap and water to avoid spreading the infection. If soap and water are not available, use hand sanitizer. How is this prevented?  Avoid contact with people who are infected with mononucleosis. An infected person may not always appear ill, but he or she can still spread the virus.  Avoid sharing utensils, drinking glasses, or toothbrushes.  Wash your hands frequently with soap and water. If soap and water are not available, use hand sanitizer.  Use the inside of your elbow to cover your mouth when coughing or sneezing. Contact a health care provider if:  Your fever is not gone after 10 days.  You have swollen lymph nodes that are not back to normal after 4 weeks.  Your activity level is not back to normal after 2 months.  Your skin or the white parts of your eyes turn yellow (jaundice).  You have constipation. This may mean that you have:  Fewer bowel movements in a week than normal.  Difficulty having a bowel movement.  Stools that are dry, hard, or larger than normal. Get help right away if:  You have severe pain in your abdomen or shoulder.  You are drooling.  You have trouble swallowing.  You have trouble breathing.  You develop a stiff neck.  You develop a severe headache.  You cannot stop vomiting.  You have jerky movements that you cannot control (seizures).  You are confused.  You have trouble with balance.  Your nose or gums begin to bleed.  You have signs of dehydration. These may include:  Weakness.  Sunken eyes.  Pale skin.  Dry mouth.  Rapid breathing or pulse. Summary  Infectious mononucleosis, or "mono," is an infection caused by the Epstein-Barr virus.  The virus that causes this condition is spread through bodily fluids. The virus is most commonly spread by kissing or sharing  drinks or utensils with an infected person.  You are more likely to develop this infection if you are 30-80 years old.  Symptoms of this condition can include sore throat, headache, fever, swollen glands, muscle aches, extreme fatigue, and swollen liver or spleen.  There is no cure for this condition. The goal of treatment is to help relieve symptoms. Treatment may include drinking plenty of water, getting a lot of rest, and taking pain relievers. This information is not intended to replace advice given to you by your health care provider. Make sure you discuss any questions you have with your health care provider. Document Released: 07/01/2000 Document Revised: 03/22/2016 Document Reviewed: 03/22/2016 Elsevier Interactive Patient Education  2017 [employer].  IF you received an x-ray today, you will receive an invoice from [facility]. Please contact [facility] at [phone] with questions or concerns regarding your invoice.   IF you received labwork today, you will receive an invoice from [place]. Please contact [facility] at [phone] with questions or concerns regarding your invoice.   Our billing staff will not be able to assist you with questions regarding bills from these companies.  You will be contacted with the lab results as soon as they are available. The fastest way to get your results is to activate your My Chart account. Instructions are located on the last page of this paperwork. If you have not heard from [place] regarding the results in 2 weeks, please contact this office.        I personally performed the services described in this documentation, which was scribed in my presence. The recorded information has been reviewed and considered, and addended by me as needed.   Signed,   Meredith Staggers, MD [facility].  07/06/16 9:52 AM

## 2016-07-07 LAB — HEPATIC FUNCTION PANEL
ALBUMIN: 4.3 g/dL (ref 3.5–5.5)
ALK PHOS: 114 IU/L (ref 39–117)
ALT: 333 IU/L — AB (ref 0–44)
AST: 68 IU/L — AB (ref 0–40)
BILIRUBIN TOTAL: 0.7 mg/dL (ref 0.0–1.2)
Bilirubin, Direct: 0.23 mg/dL (ref 0.00–0.40)
Total Protein: 7.1 g/dL (ref 6.0–8.5)

## 2016-07-08 ENCOUNTER — Ambulatory Visit (INDEPENDENT_AMBULATORY_CARE_PROVIDER_SITE_OTHER): Payer: BLUE CROSS/BLUE SHIELD | Admitting: Physician Assistant

## 2016-07-08 VITALS — BP 104/68 | HR 90 | Temp 98.2°F | Resp 18 | Ht 68.0 in | Wt 160.0 lb

## 2016-07-08 DIAGNOSIS — B2790 Infectious mononucleosis, unspecified without complication: Secondary | ICD-10-CM | POA: Diagnosis not present

## 2016-07-08 DIAGNOSIS — B2709 Gammaherpesviral mononucleosis with other complications: Secondary | ICD-10-CM

## 2016-07-08 LAB — POCT CBC
GRANULOCYTE PERCENT: 53.7 % (ref 37–80)
HEMATOCRIT: 39.1 % — AB (ref 43.5–53.7)
Hemoglobin: 14 g/dL — AB (ref 14.1–18.1)
Lymph, poc: 2.1 (ref 0.6–3.4)
MCH: 29.7 pg (ref 27–31.2)
MCHC: 35.8 g/dL — AB (ref 31.8–35.4)
MCV: 82.9 fL (ref 80–97)
MID (cbc): 0.6 (ref 0–0.9)
MPV: 7.8 fL (ref 0–99.8)
PLATELET COUNT, POC: 188 10*3/uL (ref 142–424)
POC GRANULOCYTE: 3.2 (ref 2–6.9)
POC LYMPH PERCENT: 35.7 %L (ref 10–50)
POC MID %: 10.6 %M (ref 0–12)
RBC: 4.72 M/uL (ref 4.69–6.13)
RDW, POC: 12.5 %
WBC: 5.9 10*3/uL (ref 4.6–10.2)

## 2016-07-08 LAB — CULTURE, GROUP A STREP: Strep A Culture: NEGATIVE

## 2016-07-08 MED ORDER — MAGIC MOUTHWASH W/LIDOCAINE: 5.0000 mL | Freq: Four times a day (QID) | ORAL | 0 refills | Status: DC | PRN

## 2016-07-08 MED ORDER — PREDNISONE 20 MG PO TABS: 20.0000 mg | ORAL_TABLET | Freq: Every day | ORAL | 0 refills | Status: DC

## 2016-07-08 NOTE — Patient Instructions (Addendum)
Continue antibiotic as prescribed.  Begin taking prednisone 20mg  x 5 days the day after you completed your last dose of prednisone 40mg .  Use mouthwash as needed for sore throat.  Our lab will contact you with your lab results. If they do not come back by tomorrow, it may be at your next visit that we discuss these results.   Continue to hydrate with water. Eat soft foods as to avoid irritating the sore throat.  Follow up on 07/12/16 for reevaluation. Seek care sooner if any of your symptoms worsen or if you develop any new concerning symptoms.   Thank you for letting me participate in your health and well being. Happy holidays!    Infectious Mononucleosis Infectious mononucleosis is an infection that is caused by a virus. This illness is often called "mono." You can get mono from close contact with someone who is infected (it is contagious). If you have mono, you may feel tired and have a sore throat, a headache, or a fever. Mono is usually not serious, but some people may need to be treated for it in the hospital. Follow these instructions at home: Medicines  Take over-the-counter and prescription medicines only as told by your doctor.  Do not take ampicillin or amoxicillin. This may cause a rash.  If you are under 18, do not take aspirin. Activity  Rest as needed.  Do not do any of the following activities until your doctor says that they are safe for you:  Contact sports. You may need to wait a month or longer before you play sports.  Exercise that requires a lot of energy.  Lifting heavy things.  Slowly go back to your normal activities after your fever is gone, or when your doctor says that you can. Be sure to rest when you get tired. Preventing infectious mononucleosis  Avoid contact with people who have mono. An infected person may not seem sick, but he or she can still spread the virus.  Avoid sharing forks, spoons, knives (utensils), drinking cups, or  toothbrushes.  Wash your hands often with soap and water. If you cannot use soap and water, use hand sanitizer.  Use the inside of your elbow to cover your mouth when you cough or sneeze. General instructions  Avoid kissing or sharing forks, spoons, knives, or drinking cups until your doctor approves.  Drink enough fluid to keep your pee (urine) clear or pale yellow.  Do not drink alcohol.  If you have a sore throat:  Rinse your mouth (gargle) with a salt-water mixture 3-4 times a day or as needed. To make a salt-water mixture, completely dissolve -1 tsp of salt in 1 cup of warm water.  Eat soft foods. Cold foods such as ice cream or frozen ice pops can help your throat feel better.  Try sucking on hard candy.  Wash your hands often with soap and water. If you cannot use soap and water, use hand sanitizer. Contact a doctor if:  Your fever is not gone after 10 days.  You have swelling by your jaw or neck (swollen lymph nodes), and the swelling does not go away after 4 weeks.  Your activity level is not back to normal after 2 months.  Your skin or the white parts of your eyes turn yellow (jaundice).  You have trouble pooping (have constipation). This may mean that you:  Poop (have a bowel movement) fewer times in a week than normal.  Have a hard time pooping.  Have poop  that is dry, hard, or bigger than normal. Get help right away if:  You have very bad pain in your:  Belly (abdomen).  Shoulder.  You are drooling.  You have trouble swallowing.  You have trouble breathing.  You have a stiff neck.  You have a very bad headache.  You cannot stop throwing up (vomiting).  You have jerky movements that you cannot control (seizures).  You are confused.  You have trouble with balance.  Your nose or gums start to bleed.  You have signs of body fluid loss (dehydration). These may include:  Weakness.  Sunken eyes.  Pale skin.  Dry mouth.  Fast  breathing or heartbeat. Summary  Infectious mononucleosis, or "mono," is an infection that is caused by a virus.  Mono is usually not serious, but some people may need to be treated for it in the hospital.  You should not play contact sports or lift heavy things until your doctor says that you can.  Wash your hands often with soap and water. If you cannot use soap and water, use hand sanitizer. This information is not intended to replace advice given to you by your health care provider. Make sure you discuss any questions you have with your health care provider. Document Released: 06/22/2009 Document Revised: 03/22/2016 Document Reviewed: 03/22/2016 Elsevier Interactive Patient Education  2017 [employer]  IF you received an x-ray today, you will receive an invoice from [facility]. Please contact [facility] at [phone] with questions or concerns regarding your invoice.   IF you received labwork today, you will receive an invoice from [facility]. Please contact [facility] at [phone] with questions or concerns regarding your invoice.   Our billing staff will not be able to assist you with questions regarding bills from these companies.  You will be contacted with the lab results as soon as they are available. The fastest way to get your results is to activate your My Chart account. Instructions are located on the last page of this paperwork. If you have not heard from us regarding the results in 2 weeks, please contact this office.

## 2016-07-08 NOTE — Progress Notes (Signed)
MRN: [id number] DOB: 10/30/1995  Subjective:   Rick Dixon Dixon is a 20 y.o. male presenting for chief complaint of Follow-up for mono infection,  which was [facility] weeks ago.   Pt initially seen in our office on 07/05/16 for worsening sore throat, difficulty swallowing, fever,  and abdominal pain.  Strep testing was negative.WBC of 11.3, ALT 481, AST 125.  Pt given 2L NS IVF with resolution of abdominal pain. Due to severity of right tonsillar enlargement and concern for secondary tonsillitis, pt was changed from z pack to clindamycin and started on prednisone 20mg  BID x 5 days. He was given hydrocodone and magic mouth wash for sore throat. Also given mycelex for possible thrush on tongue. Pt seen again on 07/06/16 with improving fever, thrush, and abdominal pain. His sore throat was persisting and did have one episode of loose stool. WBC decreased to 10.3 and ALT of 333, AST 68.   Since the previous visit, pt has been taking the clindamycin as prescribed. Notes the sore throat has gotten better each day. This morning he was able to eat without having ot take any pain medication. He is still using the mouthwash. He denies abdominal pain and diarrhea. He is hydrating with water daily.    Review of Systems  Constitutional: Negative for chills, diaphoresis and fever.  Cardiovascular: Negative for chest pain.  Gastrointestinal: Negative for constipation, nausea and vomiting.  Neurological: Negative for dizziness.    Rick Dixon has a current medication list which includes the following prescription(s): acetaminophen, clindamycin, clotrimazole, hydrocodone-acetaminophen, ibuprofen, magic mouthwash w/lidocaine, and prednisone. Also has No Known Allergies.  Rick Dixon  has a past medical history of History of seasonal allergies. Also  has no past surgical history on file.   Objective:   Vitals: BP 104/68 (BP Location: Right Arm, Patient Position: Sitting, Cuff Size: Small)   Pulse 90    Temp 98.2 F (36.8 C) (Oral)   Resp 18   Ht 5\' 8"  (1.727 m)   Wt 160 lb (72.6 kg)   SpO2 98%   BMI 24.33 kg/m   Physical Exam  Constitutional: He is oriented to person, place, and time. He appears well-developed and well-nourished.  HENT:  Head: Normocephalic and atraumatic.  Mouth/Throat: Uvula is midline and mucous membranes are normal. Posterior oropharyngeal erythema present. Tonsils are 3+ on the right. Tonsils are 2+ on the left. Tonsillar exudate.  Eyes: Conjunctivae are normal.  Neck: Normal range of motion.  Cardiovascular: Normal rate, regular rhythm and normal heart sounds.   Pulmonary/Chest: Effort normal.  Abdominal: Soft. Normal appearance. Bowel sounds are increased. There is no hepatosplenomegaly. There is no tenderness. There is negative Murphy's sign.  Lymphadenopathy:       Head (right side): No submental, no submandibular, no tonsillar, no preauricular, no posterior auricular and no occipital adenopathy present.       Head (left side): No submental, no submandibular, no tonsillar, no preauricular, no posterior auricular and no occipital adenopathy present.    He has cervical adenopathy.       Right cervical: Deep cervical and posterior cervical adenopathy present.       Left cervical: Deep cervical and posterior cervical adenopathy present.       Right: No supraclavicular adenopathy present.       Left: No supraclavicular adenopathy present.  Neurological: He is alert and oriented to person, place, and time.  Skin: Skin is warm and dry.  Psychiatric: He has a normal mood and affect.  Vitals reviewed.   Results for orders placed or performed in visit on 07/08/16 (from the past 24 hour(s))  POCT CBC     Status: Abnormal   Collection Time: 07/08/16 11:24 AM  Result Value Ref Range   WBC 5.9 4.6 - 10.2 K/uL   Lymph, poc 2.1 0.6 - 3.4   POC LYMPH PERCENT 35.7 10 - 50 %L   MID (cbc) 0.6 0 - 0.9   POC MID % 10.6 0 - 12 %M   POC Granulocyte 3.2 2 - 6.9    Granulocyte percent 53.7 37 - 80 %G   RBC 4.72 4.69 - 6.13 M/uL   Hemoglobin 14.0 (A) 14.1 - 18.1 g/dL   HCT, POC 09.839.1 (A) 11.943.5 - 53.7 %   MCV 82.9 80 - 97 fL   MCH, POC 29.7 27 - 31.2 pg   MCHC 35.8 (A) 31.8 - 35.4 g/dL   RDW, POC 14.712.5 %   Platelet Count, POC 188 142 - 424 K/uL   MPV 7.8 0 - 99.8 fL    Assessment and Plan :  1. Gammaherpesviral mononucleosis with other complications -Pt's symptoms are improving, WBC decreased to 5.9. Oral thrush has resolved. Right tonsil is still more swollen compared to left tonsil, however, it has improved since most recent visit. Will extend prednisone course for an additional 5 days. Instructed to take 20mg  qd x 5 the day after his last dose of 40mg . Instructed to continue clindamycin and magic mouthwash. Discontinue mycelex. Considering this is a holiday weekend, pt instructed to seek care immediately at the ER if any of his symptoms worsen or he develops any new concerning symptoms.  - POCT CBC - Hepatic Function Panel - magic mouthwash w/lidocaine SOLN; Take 5 mLs by mouth 4 (four) times daily as needed for mouth pain.  Dispense: 120 mL; Refill: 0 - predniSONE (DELTASONE) 20 MG tablet; Take 1 tablet (20 mg total) by mouth daily with breakfast.  Dispense: 5 tablet; Refill: 0  -Follow up in our clinic on 07/12/16 for further reevaluation   Benjiman CoreBrittany Faiz Weber, PA-C [facility] [phone] 07/08/2016 11:32 AM

## 2016-07-09 LAB — HEPATIC FUNCTION PANEL
ALT: 198 IU/L — AB (ref 0–44)
AST: 29 IU/L (ref 0–40)
Albumin: 4 g/dL (ref 3.5–5.5)
Alkaline Phosphatase: 100 IU/L (ref 39–117)
Bilirubin Total: 0.6 mg/dL (ref 0.0–1.2)
Bilirubin, Direct: 0.21 mg/dL (ref 0.00–0.40)
Total Protein: 7.1 g/dL (ref 6.0–8.5)

## 2016-07-12 ENCOUNTER — Ambulatory Visit (INDEPENDENT_AMBULATORY_CARE_PROVIDER_SITE_OTHER): Payer: BLUE CROSS/BLUE SHIELD | Admitting: Physician Assistant

## 2016-07-12 VITALS — BP 118/82 | HR 99 | Temp 97.8°F | Resp 18 | Ht 68.0 in | Wt 161.0 lb

## 2016-07-12 DIAGNOSIS — B2709 Gammaherpesviral mononucleosis with other complications: Secondary | ICD-10-CM

## 2016-07-12 NOTE — Patient Instructions (Addendum)
Follow up in one week for repeat liver enzyme testing. Continue taking the clindamycin and prednisone until you have finished the course completely. Use magic mouthwash as needed. If any symptoms begin to worsen, seek care sooner. Thank you for letting me participate in your health and well being!     IF you received an x-ray today, you will receive an invoice from [facility]. Please contact [facility] at [phone] with questions or concerns regarding your invoice.   IF you received labwork today, you will receive an invoice from [facility]. Please contact [facility] at [phone] with questions or concerns regarding your invoice.   Our billing staff will not be able to assist you with questions regarding bills from these companies.  You will be contacted with the lab results as soon as they are available. The fastest way to get your results is to activate your My Chart account. Instructions are located on the last page of this paperwork. If you have not heard from us regarding the results in 2 weeks, please contact this office.

## 2016-07-12 NOTE — Progress Notes (Signed)
MRN: [id number] DOB: 07/27/1995  Subjective:   Rick Dixon is a 20 y.o. male presenting for follow up on mono, diagnosed at [facility].5 weeks ago. Please see last note for extensive detail.  Pt is being closely followed by our clinic for severe tonsillitis, most notable on right tonsil and down trending LFTs.   Since the previous visit on 07/08/16 pt was started on an extended course of prednisone, which he is still taking. Notes sleep disturbance as a side effect of the medication. He is still taking clindamycin and using the magic mouthwash for sore throat. Today, he notes that he feels much better. Notes that all of his throat pain has resolved. Denies fever, chills, vomiting, diarrhea, upset stomach, and abdominal pain.   Rick Dixon has a current medication list which includes the following prescription(s): acetaminophen, clindamycin, clotrimazole, ibuprofen, magic mouthwash w/lidocaine, prednisone, and hydrocodone-acetaminophen. Also has No Known Allergies.  Rick Dixon  has a past medical history of History of seasonal allergies. Also  has no past surgical history on file.   Objective:   Vitals: BP 118/82 (BP Location: Right Arm, Patient Position: Sitting, Cuff Size: Large)   Pulse 99   Resp 18   Ht 5\' 8"  (1.727 m)   Wt 161 lb (73 kg)   SpO2 99%   BMI 24.48 kg/m   Physical Exam  Constitutional: He is oriented to person, place, and time. He appears well-developed and well-nourished. No distress.  HENT:  Head: Normocephalic and atraumatic.  Mouth/Throat: Uvula is midline and mucous membranes are normal. Posterior oropharyngeal erythema present. Tonsils are 2+ on the right. Tonsils are 2+ on the left. No tonsillar exudate.  Right tonsil only slightly more swollen than left tonsil.   Eyes: Conjunctivae are normal.  Neck: Normal range of motion.  Cardiovascular: Normal rate, regular rhythm and normal heart sounds.   Pulmonary/Chest: Effort normal and breath sounds normal.    Abdominal: Soft. Normal appearance and bowel sounds are normal. There is no hepatomegaly. There is no tenderness.  Lymphadenopathy:       Head (right side): No submental, no submandibular, no tonsillar, no preauricular, no posterior auricular and no occipital adenopathy present.       Head (left side): No submental, no submandibular, no tonsillar, no preauricular, no posterior auricular and no occipital adenopathy present.    He has cervical adenopathy.       Right cervical: Posterior cervical ( minimally palpable ) adenopathy present. No superficial cervical and no deep cervical adenopathy present.      Left cervical: Posterior cervical ( minimally palpable ) adenopathy present. No superficial cervical and no deep cervical adenopathy present.       Right: No supraclavicular adenopathy present.       Left: No supraclavicular adenopathy present.  Neurological: He is alert and oriented to person, place, and time.  Skin: Skin is warm and dry.  Psychiatric: He has a normal mood and affect.  Vitals reviewed.  No results found for this or any previous visit (from the past 24 hour(s)).  Lab Results  Component Value Date   WBC 5.9 07/08/2016   Lab Results  Component Value Date   ALT 198 (H) 07/08/2016   AST 29 07/08/2016   ALKPHOS 100 07/08/2016   BILITOT 0.6 07/08/2016     Assessment and Plan :  1. Gammaherpesviral mononucleosis with other complications -Pt signs and symptoms are improving significantly. Pt instructed to complete prednisone and antibiotic course and to follow up in one  week for repeat hepatic function testing as the value is trending down. If any symptoms return or patient starts to feel worse, instructed to follow up sooner with our clinic or the ED.   Benjiman CoreBrittany Wiseman, PA-C  [facility] 07/12/2016 9:02 AM

## 2016-07-19 ENCOUNTER — Telehealth: Payer: Self-pay | Admitting: Emergency Medicine

## 2016-07-19 ENCOUNTER — Encounter: Payer: Self-pay | Admitting: Emergency Medicine

## 2016-07-19 NOTE — Telephone Encounter (Signed)
-----   Message from Shade FloodJeffrey R Greene, MD sent at 07/14/2016 11:35 AM EST ----- Call patient - throat culture negative for strep throat bacteria.  Continue care as discussed at last office visit, return if any worsening.

## 2016-07-19 NOTE — Telephone Encounter (Signed)
-----   Message from Magdalene RiverBrittany D Wiseman, PA-C sent at 07/10/2016 12:52 AM EST ----- Please call pt and let him know his ALT is continuing to trend down. [phone].

## 2016-07-20 ENCOUNTER — Ambulatory Visit: Payer: BLUE CROSS/BLUE SHIELD | Admitting: Physician Assistant

## 2016-07-22 NOTE — Progress Notes (Signed)
Unable to reach pt. Voicemail not set up. Will print lab letter to be mailed

## 2016-11-29 ENCOUNTER — Ambulatory Visit (INDEPENDENT_AMBULATORY_CARE_PROVIDER_SITE_OTHER): Payer: Self-pay

## 2016-11-29 ENCOUNTER — Ambulatory Visit (INDEPENDENT_AMBULATORY_CARE_PROVIDER_SITE_OTHER): Payer: BLUE CROSS/BLUE SHIELD | Admitting: Orthopaedic Surgery

## 2016-11-29 ENCOUNTER — Encounter (INDEPENDENT_AMBULATORY_CARE_PROVIDER_SITE_OTHER): Payer: Self-pay | Admitting: Orthopaedic Surgery

## 2016-11-29 DIAGNOSIS — M25511 Pain in right shoulder: Secondary | ICD-10-CM

## 2016-11-29 NOTE — Progress Notes (Signed)
Office Visit Note   Patient: Rick Dixon           Date of Birth: 1995/10/02           MRN: [id number] Visit Date: 11/29/2016              Requested by: Loyola Mast, MD [address] [place], [place] [address] PCP: Loyola Mast, MD   Assessment & Plan: Visit Diagnoses:  1. Right shoulder pain, unspecified chronicity     Plan:  #1: MRI arthrogram of the right shoulder to rule out an anterior labral tear #2: Follow up 2 weeks for review.  Follow-Up Instructions: Return in about 2 weeks (around 12/13/2016) for review of mri.   Orders:  Orders Placed This Encounter  Procedures  . XR Shoulder Right   No orders of the defined types were placed in this encounter.     Procedures: No procedures performed   Clinical Data: No additional findings.   Subjective: Chief Complaint  Patient presents with  . Right Shoulder - Pain  . Shoulder Pain    fall while playing rugby onto right shoulder about 3-4 months ago. pain has decreased since initial incident. decreased mobility. painful to reach arm across body or elevate above head. no medications for pain.    Rick Dixon is a very pleasant 21 year old white male who is seen today for evaluation of his right shoulder. He states that approximately 3-4 months ago he was playing rugby and landed onto his right shoulder. At that time he had some pain discomfort but over a period time it had improved. He states though that he is having pain with chest maneuvers. Also whenever he takes arm up over his head. He feels he has decreased range of motion since his injury. He continues to believe her him more in the superior anterior aspect of the shoulder. He is not taking any medication this time for this.        Review of Systems  Constitutional: Negative.   HENT: Negative.   Respiratory: Negative.   Cardiovascular: Negative.   Gastrointestinal: Negative.   Genitourinary: Negative.   Skin: Negative.   Neurological: Negative.     Hematological: Negative.   Psychiatric/Behavioral: Negative.      Objective: Vital Signs: There were no vitals taken for this visit.  Physical Exam  Constitutional: He is oriented to person, place, and time. He appears well-developed and well-nourished.  HENT:  Head: Normocephalic and atraumatic.  Eyes: EOM are normal. Pupils are equal, round, and reactive to light.  Pulmonary/Chest: Effort normal.  Neurological: He is alert and oriented to person, place, and time.  Skin: Skin is warm and dry.  Psychiatric: He has a normal mood and affect. His behavior is normal. Judgment and thought content normal.    Ortho Exam  Right shoulder reveals 180 of forward flexion, 180 of abduction, 90 of external rotation with the arm at 90 of abduction and 80 of internal rotation. He does have some weakness with resistance of internal rotation with the elbow at his side. He has strength in abduction. Good strength biceps and triceps. Does have a questionable apprehension sign with the arm at 90 of abduction and external rotation. Questionable clunk as you bring him into internal rotation. Tender at the anterior portion of the shoulder.  Specialty Comments:  No specialty comments available.  Imaging: Xr Shoulder Right  Result Date: 11/29/2016 Three-view x-ray of the right shoulder reveals a type I acromion. A little elevation of the distal  clavicle in relation to the acromion on the Y view.  Current Outpatient Prescriptions  Medication Sig Dispense Refill  . ibuprofen (ADVIL,MOTRIN) 200 MG tablet Take 200 mg by mouth every 6 (six) hours as needed.    Marland Kitchen. acetaminophen (TYLENOL) 325 MG tablet Take 650 mg by mouth every 6 (six) hours as needed.    . clindamycin (CLEOCIN) 300 MG capsule Take 1 capsule (300 mg total) by mouth 3 (three) times daily. (Patient not taking: Reported on 11/29/2016) 30 capsule 0  . clotrimazole (MYCELEX) 10 MG troche Take 1 tablet (10 mg total) by mouth 5 (five) times daily.  (Patient not taking: Reported on 11/29/2016) 70 tablet 0  . HYDROcodone-acetaminophen (NORCO/VICODIN) 5-325 MG tablet Take 1 tablet by mouth every 6 (six) hours as needed for moderate pain. (Patient not taking: Reported on 07/12/2016) 15 tablet 0  . magic mouthwash w/lidocaine SOLN Take 5 mLs by mouth 4 (four) times daily as needed for mouth pain. (Patient not taking: Reported on 11/29/2016) 120 mL 0  . predniSONE (DELTASONE) 20 MG tablet Take 1 tablet (20 mg total) by mouth daily with breakfast. (Patient not taking: Reported on 11/29/2016) 5 tablet 0   No current facility-administered medications for this visit.      PMFS History: Patient Active Problem List   Diagnosis Date Noted  . Fracture of rib of right side with delayed healing 07/31/2013   Past Medical History:  Diagnosis Date  . History of seasonal allergies     No family history on file.  No past surgical history on file. Social History   Occupational History  . Not on file.   Social History Main Topics  . Smoking status: Never Smoker  . Smokeless tobacco: Never Used  . Alcohol use No  . Drug use: No  . Sexual activity: No

## 2016-12-07 ENCOUNTER — Other Ambulatory Visit (HOSPITAL_COMMUNITY)
Admission: RE | Admit: 2016-12-07 | Discharge: 2016-12-07 | Disposition: A | Discharge status: Home / Self Care | Payer: BLUE CROSS/BLUE SHIELD | Source: Ambulatory Visit | Attending: Family Medicine | Admitting: Family Medicine

## 2016-12-07 ENCOUNTER — Encounter: Payer: Self-pay | Admitting: Family Medicine

## 2016-12-07 ENCOUNTER — Ambulatory Visit (INDEPENDENT_AMBULATORY_CARE_PROVIDER_SITE_OTHER): Payer: BLUE CROSS/BLUE SHIELD | Admitting: Family Medicine

## 2016-12-07 VITALS — BP 90/58 | HR 64 | Temp 98.4°F | Ht 68.0 in | Wt 173.0 lb

## 2016-12-07 DIAGNOSIS — Z7251 High risk heterosexual behavior: Secondary | ICD-10-CM

## 2016-12-07 DIAGNOSIS — Z23 Encounter for immunization: Secondary | ICD-10-CM

## 2016-12-07 DIAGNOSIS — J301 Allergic rhinitis due to pollen: Secondary | ICD-10-CM

## 2016-12-07 DIAGNOSIS — Z0000 Encounter for general adult medical examination without abnormal findings: Secondary | ICD-10-CM | POA: Diagnosis not present

## 2016-12-07 DIAGNOSIS — J302 Other seasonal allergic rhinitis: Secondary | ICD-10-CM | POA: Insufficient documentation

## 2016-12-07 NOTE — Patient Instructions (Addendum)
Menveo (ACWy Coverage) and Bexsero (B coverage) for meningitis.   Follow up as soon as you get back for second bexsero vaccination  Due to [place] trip want you to see travel medicine Health department travel clinic [phone]  Also have [facility] [phone] Also [facility] has a great travel clinic  Always use protection when having sex

## 2016-12-07 NOTE — Progress Notes (Signed)
Phone: [phone]  Subjective:  Patient presents today to establish care.  Prior patient of Loyola Mast, MD- last visit a few years ago. Chief complaint-noted.   See problem oriented charting  The following were reviewed and entered/updated in epic: Past Medical History:  Diagnosis Date  . Seasonal allergies    claritin prn   Patient Active Problem List   Diagnosis Date Noted  . Seasonal allergies   . Fracture of rib of right side with delayed healing 07/31/2013   Past Surgical History:  Procedure Laterality Date  . ANTERIOR CRUCIATE LIGAMENT REPAIR     related to wrestling. Dr. Cleophas Dunker    Family History  Problem Relation Age of Onset  . Healthy Mother   . Healthy Father        overweight  . Healthy Sister   . Healthy Brother        colonoscopy at age 77-6- benign polyp  . Colon cancer Paternal Grandmother        at age 31  . Healthy Sister   . Arthritis Maternal Grandfather        osteo.     Medications- reviewed and updated No current outpatient prescriptions on file.   No current facility-administered medications for this visit.     Allergies-reviewed and updated No Known Allergies  Social History   Social History  . Marital status: Single    Spouse name: N/A  . Number of children: N/A  . Years of education: N/A   Social History Main Topics  . Smoking status: Never Smoker  . Smokeless tobacco: Never Used  . Alcohol use 4.8 - 6.0 oz/week    8 - 10 Standard drinks or equivalent per week  . Drug use: No  . Sexual activity: No   Other Topics Concern  . None   Social History Narrative   Single. Lives at home in summer and in dorms during the year      Graduates 2020 from [employer]. [place] [employer] major. 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    HENT: Negative for hearing loss and tinnitus.   Eyes: Negative for blurred vision and double vision.  Respiratory: Negative for cough and hemoptysis.   Cardiovascular: Negative for chest pain and palpitations.  Gastrointestinal: Negative for heartburn and nausea.  Genitourinary: Negative for dysuria and urgency.  Musculoskeletal: Negative for myalgias and neck pain.  Skin: Negative for itching and rash.  Neurological: Negative for dizziness and headaches.  Endo/Heme/Allergies: Negative for polydipsia. Does not bruise/bleed easily.  Psychiatric/Behavioral: Negative for hallucinations, substance abuse and suicidal ideas.   Objective: BP (!) 90/58 (BP Location: Left Arm, Patient Position: Sitting, Cuff Size: Large)   Pulse 64   Temp 98.4 F (36.9 C) (Oral)   Ht 5\' 8"  (1.727 m)   Wt 173 lb (78.5 kg)   SpO2 98%   BMI 26.30 kg/m  Gen: NAD, resting comfortably HEENT: Mucous membranes are moist. Oropharynx normal. TM normal. Eyes: sclera and lids normal, PERRLA Neck: no thyromegaly, no cervical lymphadenopathy CV: RRR no murmurs rubs or gallops Lungs: CTAB no crackles, wheeze, rhonchi Abdomen: soft/nontender/nondistended/normal bowel sounds. No rebound or guarding.  Ext: no edema Skin: warm, dry Neuro: 5/5 strength in upper and lower extremities, normal gait, normal reflexes GU: normal exam- no signs of  herpes or genital warts  Assessment/Plan:  21 y.o. male presenting for annual physical.  Health Maintenance counseling: 1. Anticipatory guidance: Patient counseled regarding regular dental exams - at least every 2 years- advied at least once a year, eye exams - approximately yearly, wearing seatbelts.  2. Risk factor reduction:  Advised patient of need for regular exercise and diet rich and fruits and vegetables to reduce risk of heart attack and stroke. Exercise- excellent- very active. Diet-good balance protein, fruits, vegetables.  Wt Readings from Last 3 Encounters:  12/07/16  173 lb (78.5 kg)  07/12/16 161 lb (73 kg)  07/08/16 160 lb (72.6 kg)  3. Immunizations/screenings/ancillary studies- to be abstracted ONly had 1 meningitis vaccine- we will give menveo to complete series. And start bexsero to cover for meningitis type B Health Maintenance Due  Topic Date Due  . TETANUS/TDAP - 03/06/2008 per [facility]- will update 06/20/2015  4. Prostate cancer screening- no family history, start at age 21  5. Colon cancer screening - grandother at 3555- will need to follow history of dad and brothers- consider starting at 6840 or 45 6. Skin cancer screening/prevention- advised regular sunscreen use 7. Testicular cancer screening- advised monthly self exams - already doing 8. STD screening- patient opts in   Status of chronic or acute concerns  Going [place]- advised of travel clinic for [place] trip primarily- suspects will be in some [place] areas  Very mild anxiety- sparing panic attacks. Mainly around women. Declines counseling.   1 year physical. Needs bexsero as soon as he gets back from foreign travel  Orders Placed This Encounter  Procedures  . HIV antibody    [facility]  . RPR    [facility]  gc/chlamydia/trichomonas  Return precautions advised.  Tana ConchStephen Shainna Faux, MD

## 2016-12-07 NOTE — Addendum Note (Signed)
Addended by: Heide SparkSOUTHERN HIZER, JAMIE M on: 12/07/2016 11:49 AM   Modules accepted: Orders

## 2016-12-08 ENCOUNTER — Telehealth: Payer: Self-pay | Admitting: *Deleted

## 2016-12-08 LAB — URINE CYTOLOGY ANCILLARY ONLY
CHLAMYDIA, DNA PROBE: NEGATIVE
NEISSERIA GONORRHEA: NEGATIVE
Trichomonas: NEGATIVE

## 2016-12-08 LAB — RPR

## 2016-12-08 LAB — HIV ANTIBODY (ROUTINE TESTING W REFLEX): HIV 1&2 Ab, 4th Generation: NONREACTIVE

## 2016-12-08 NOTE — Telephone Encounter (Signed)
Contacted patient to discuss medication administration error that was done at 12/08/16 office visit; patient had already been made aware of error immediately after appointment on 12/08/16 by administering LPN. Provided patient with education from [facility] at [facility] as well as Alda Ponderawn Wilson, [facility] pharmacist, advised patient there were no risks for any adverse effects due to receiving expired medication, advised patient that pharmaceutical company did recommend repeating the Bexsero immunization d/t efficacy of product may be decreased past 30 days post expiration; advised patient Dr. Durene CalHunter was made aware and MD suggests patient to get immunization at next office visit. Also, patient will not be charged for immunization on 12/07/16 due to error. Instructed patient to contact our office should any questions or concerns may arise; patient voiced understanding to instructions.

## 2016-12-20 ENCOUNTER — Telehealth: Payer: Self-pay

## 2016-12-20 NOTE — Telephone Encounter (Signed)
error 

## 2016-12-20 NOTE — Progress Notes (Signed)
Patient was contacted immediately after he had left the office and notified that the immunization Bexsero he had received, had expired one prior to administration. Dr. Durene CalHunter had also been notified and recommended that patient receive another injection at his next visit. Patient verbalized understanding and denied having any questions at this time. Patient to contact office if any concerns or questions.

## 2016-12-23 ENCOUNTER — Ambulatory Visit
Admission: RE | Admit: 2016-12-23 | Discharge: 2016-12-23 | Disposition: A | Discharge status: Home / Self Care | Payer: BLUE CROSS/BLUE SHIELD | Source: Ambulatory Visit | Attending: Orthopedic Surgery | Admitting: Orthopedic Surgery

## 2016-12-23 DIAGNOSIS — M25511 Pain in right shoulder: Secondary | ICD-10-CM | POA: Diagnosis not present

## 2016-12-23 MED ORDER — IOPAMIDOL (ISOVUE-M 200) INJECTION 41%
15.0000 mL | Freq: Once | INTRAMUSCULAR | Status: AC
  Administered 2016-12-23: 15 mL via INTRA_ARTICULAR

## 2016-12-28 ENCOUNTER — Telehealth: Payer: Self-pay

## 2016-12-28 NOTE — Telephone Encounter (Signed)
Patient would like to have a phone call from Dr. Cleophas DunkerWhitfield concerning MRI results.  CB# is [phone].

## 2016-12-28 NOTE — Telephone Encounter (Signed)
called

## 2016-12-28 NOTE — Telephone Encounter (Signed)
Please call.

## 2017-01-02 ENCOUNTER — Encounter (INDEPENDENT_AMBULATORY_CARE_PROVIDER_SITE_OTHER): Payer: Self-pay | Admitting: Orthopaedic Surgery

## 2017-01-02 ENCOUNTER — Ambulatory Visit (INDEPENDENT_AMBULATORY_CARE_PROVIDER_SITE_OTHER): Payer: BLUE CROSS/BLUE SHIELD | Admitting: Orthopaedic Surgery

## 2017-01-02 VITALS — BP 106/60 | HR 64 | Resp 12 | Ht 68.0 in | Wt 170.0 lb

## 2017-01-02 DIAGNOSIS — M25511 Pain in right shoulder: Secondary | ICD-10-CM

## 2017-01-02 DIAGNOSIS — G8929 Other chronic pain: Secondary | ICD-10-CM

## 2017-01-02 MED ORDER — BUPIVACAINE HCL 0.5 % IJ SOLN
2.0000 mL | INTRAMUSCULAR | Status: AC | PRN
  Administered 2017-01-02: 2 mL via INTRA_ARTICULAR

## 2017-01-02 MED ORDER — METHYLPREDNISOLONE ACETATE 40 MG/ML IJ SUSP
80.0000 mg | INTRAMUSCULAR | Status: AC | PRN
  Administered 2017-01-02: 80 mg

## 2017-01-02 MED ORDER — LIDOCAINE HCL 1 % IJ SOLN
2.0000 mL | INTRAMUSCULAR | Status: AC | PRN
  Administered 2017-01-02: 2 mL

## 2017-01-02 NOTE — Patient Instructions (Signed)
Shoulder Exercises Ask your health care provider which exercises are safe for you. Do exercises exactly as told by your health care provider and adjust them as directed. It is normal to feel mild stretching, pulling, tightness, or discomfort as you do these exercises, but you should stop right away if you feel sudden pain or your pain gets worse.Do not begin these exercises until told by your health care provider. RANGE OF MOTION EXERCISES These exercises warm up your muscles and joints and improve the movement and flexibility of your shoulder. These exercises also help to relieve pain, numbness, and tingling. These exercises involve stretching your injured shoulder directly. Exercise A: Pendulum  1. Stand near a wall or a surface that you can hold onto for balance. 2. Bend at the waist and let your left / right arm hang straight down. Use your other arm to support you. Keep your back straight and do not lock your knees. 3. Relax your left / right arm and shoulder muscles, and move your hips and your trunk so your left / right arm swings freely. Your arm should swing because of the motion of your body, not because you are using your arm or shoulder muscles. 4. Keep moving your body so your arm swings in the following directions, as told by your health care provider: ? Side to side. ? Forward and backward. ? In clockwise and counterclockwise circles. 5. Continue each motion for __________ seconds, or for as long as told by your health care provider. 6. Slowly return to the starting position. Repeat __________ times. Complete this exercise __________ times a day. Exercise B:Flexion, Standing  1. Stand and hold a broomstick, a cane, or a similar object. Place your hands a little more than shoulder-width apart on the object. Your left / right hand should be palm-up, and your other hand should be palm-down. 2. Keep your elbow straight and keep your shoulder muscles relaxed. Push the stick down with  your healthy arm to raise your left / right arm in front of your body, and then over your head until you feel a stretch in your shoulder. ? Avoid shrugging your shoulder while you raise your arm. Keep your shoulder blade tucked down toward the middle of your back. 3. Hold for __________ seconds. 4. Slowly return to the starting position. Repeat __________ times. Complete this exercise __________ times a day. Exercise C: Abduction, Standing 1. Stand and hold a broomstick, a cane, or a similar object. Place your hands a little more than shoulder-width apart on the object. Your left / right hand should be palm-up, and your other hand should be palm-down. 2. While keeping your elbow straight and your shoulder muscles relaxed, push the stick across your body toward your left / right side. Raise your left / right arm to the side of your body and then over your head until you feel a stretch in your shoulder. ? Do not raise your arm above shoulder height, unless your health care provider tells you to do that. ? Avoid shrugging your shoulder while you raise your arm. Keep your shoulder blade tucked down toward the middle of your back. 3. Hold for __________ seconds. 4. Slowly return to the starting position. Repeat __________ times. Complete this exercise __________ times a day. Exercise D:Internal Rotation  1. Place your left / right hand behind your back, palm-up. 2. Use your other hand to dangle an exercise band, a towel, or a similar object over your shoulder. Grasp the band with   your left / right hand so you are holding onto both ends. 3. Gently pull up on the band until you feel a stretch in the front of your left / right shoulder. ? Avoid shrugging your shoulder while you raise your arm. Keep your shoulder blade tucked down toward the middle of your back. 4. Hold for __________ seconds. 5. Release the stretch by letting go of the band and lowering your hands. Repeat __________ times. Complete  this exercise __________ times a day. STRETCHING EXERCISES These exercises warm up your muscles and joints and improve the movement and flexibility of your shoulder. These exercises also help to relieve pain, numbness, and tingling. These exercises are done using your healthy shoulder to help stretch the muscles of your injured shoulder. Exercise E: Corner Stretch (External Rotation and Abduction)  1. Stand in a doorway with one of your feet slightly in front of the other. This is called a staggered stance. If you cannot reach your forearms to the door frame, stand facing a corner of a room. 2. Choose one of the following positions as told by your health care provider: ? Place your hands and forearms on the door frame above your head. ? Place your hands and forearms on the door frame at the height of your head. ? Place your hands on the door frame at the height of your elbows. 3. Slowly move your weight onto your front foot until you feel a stretch across your chest and in the front of your shoulders. Keep your head and chest upright and keep your abdominal muscles tight. 4. Hold for __________ seconds. 5. To release the stretch, shift your weight to your back foot. Repeat __________ times. Complete this stretch __________ times a day. Exercise F:Extension, Standing 1. Stand and hold a broomstick, a cane, or a similar object behind your back. ? Your hands should be a little wider than shoulder-width apart. ? Your palms should face away from your back. 2. Keeping your elbows straight and keeping your shoulder muscles relaxed, move the stick away from your body until you feel a stretch in your shoulder. ? Avoid shrugging your shoulders while you move the stick. Keep your shoulder blade tucked down toward the middle of your back. 3. Hold for __________ seconds. 4. Slowly return to the starting position. Repeat __________ times. Complete this exercise __________ times a day. STRENGTHENING  EXERCISES These exercises build strength and endurance in your shoulder. Endurance is the ability to use your muscles for a long time, even after they get tired. Exercise G:External Rotation  1. Sit in a stable chair without armrests. 2. Secure an exercise band at elbow height on your left / right side. 3. Place a soft object, such as a folded towel or a small pillow, between your left / right upper arm and your body to move your elbow a few inches away (about 10 cm) from your side. 4. Hold the end of the band so it is tight and there is no slack. 5. Keeping your elbow pressed against the soft object, move your left / right forearm out, away from your abdomen. Keep your body steady so only your forearm moves. 6. Hold for __________ seconds. 7. Slowly return to the starting position. Repeat __________ times. Complete this exercise __________ times a day. Exercise H:Shoulder Abduction  1. Sit in a stable chair without armrests, or stand. 2. Hold a __________ weight in your left / right hand, or hold an exercise band with both hands.   3. Start with your arms straight down and your left / right palm facing in, toward your body. 4. Slowly lift your left / right hand out to your side. Do not lift your hand above shoulder height unless your health care provider tells you that this is safe. ? Keep your arms straight. ? Avoid shrugging your shoulder while you do this movement. Keep your shoulder blade tucked down toward the middle of your back. 5. Hold for __________ seconds. 6. Slowly lower your arm, and return to the starting position. Repeat __________ times. Complete this exercise __________ times a day. Exercise I:Shoulder Extension 1. Sit in a stable chair without armrests, or stand. 2. Secure an exercise band to a stable object in front of you where it is at shoulder height. 3. Hold one end of the exercise band in each hand. Your palms should face each other. 4. Straighten your elbows and  lift your hands up to shoulder height. 5. Step back, away from the secured end of the exercise band, until the band is tight and there is no slack. 6. Squeeze your shoulder blades together as you pull your hands down to the sides of your thighs. Stop when your hands are straight down by your sides. Do not let your hands go behind your body. 7. Hold for __________ seconds. 8. Slowly return to the starting position. Repeat __________ times. Complete this exercise __________ times a day. Exercise J:Standing Shoulder Row 1. Sit in a stable chair without armrests, or stand. 2. Secure an exercise band to a stable object in front of you so it is at waist height. 3. Hold one end of the exercise band in each hand. Your palms should be in a thumbs-up position. 4. Bend each of your elbows to an "L" shape (about 90 degrees) and keep your upper arms at your sides. 5. Step back until the band is tight and there is no slack. 6. Slowly pull your elbows back behind you. 7. Hold for __________ seconds. 8. Slowly return to the starting position. Repeat __________ times. Complete this exercise __________ times a day. Exercise K:Shoulder Press-Ups  1. Sit in a stable chair that has armrests. Sit upright, with your feet flat on the floor. 2. Put your hands on the armrests so your elbows are bent and your fingers are pointing forward. Your hands should be about even with the sides of your body. 3. Push down on the armrests and use your arms to lift yourself off of the chair. Straighten your elbows and lift yourself up as much as you comfortably can. ? Move your shoulder blades down, and avoid letting your shoulders move up toward your ears. ? Keep your feet on the ground. As you get stronger, your feet should support less of your body weight as you lift yourself up. 4. Hold for __________ seconds. 5. Slowly lower yourself back into the chair. Repeat __________ times. Complete this exercise __________ times a  day. Exercise L: Wall Push-Ups  1. Stand so you are facing a stable wall. Your feet should be about one arm-length away from the wall. 2. Lean forward and place your palms on the wall at shoulder height. 3. Keep your feet flat on the floor as you bend your elbows and lean forward toward the wall. 4. Hold for __________ seconds. 5. Straighten your elbows to push yourself back to the starting position. Repeat __________ times. Complete this exercise __________ times a day. This information is not intended to replace advice   given to you by your health care provider. Make sure you discuss any questions you have with your health care provider. Document Released: 05/18/2005 Document Revised: 03/28/2016 Document Reviewed: 03/15/2015 Elsevier Interactive Patient Education  2018 [employer]  

## 2017-01-02 NOTE — Progress Notes (Signed)
Office Visit Note   Patient: Rick Dixon           Date of Birth: 23-May-1996           MRN: [id number] Visit Date: 01/02/2017              Requested by: Shelva Majestic, MD [address] [place], [place] [address] PCP: Shelva Majestic, MD   Assessment & Plan: Visit Diagnoses:  1. Chronic right shoulder pain   MRI with contrast right shoulder was completely negative. Rick Dixon is still having some discomfort with abduction extension and external rotation area and I think from a clinical standpoint is probably had a subluxation of her shoulder was some persistent discomfort.  Plan: Subacromial and intra-articular cortisone injection right shoulder. Exercises. Rick Dixon is going  out of the country for [place] and will let me know how he does over the next several months. I be interested in how he does with the injections and exercises and time.  Follow-Up Instructions: Return if symptoms worsen or fail to improve.   Orders:  No orders of the defined types were placed in this encounter.  No orders of the defined types were placed in this encounter.     Procedures: Large Joint Inj Date/Time: 01/02/2017 5:00 PM Performed by: Valeria Batman Authorized by: Valeria Batman   Consent Given by:  Patient Timeout: prior to procedure the correct patient, procedure, and site was verified   Indications:  Pain Location:  Shoulder Site:  R glenohumeral Prep: patient was prepped and draped in usual sterile fashion   Needle Size:  25 G Needle Length:  1.5 inches Approach:  Posterior Ultrasound Guidance: No   Fluoroscopic Guidance: No   Arthrogram: No   Medications:  2 mL lidocaine 1 %; 2 mL bupivacaine 0.5 %; 80 mg methylPREDNISolone acetate 40 MG/ML Aspiration Attempted: No   Patient tolerance:  Patient tolerated the procedure well with no immediate complications     Clinical Data: No additional findings.   Subjective: Chief Complaint  Patient presents  with  . Right Shoulder - Results    MRI review of LEFT shoulder  As previously noted in his office notes Rick Dixon has been having trouble now 4-5 months after injuring his right shoulder playing rugby he is having some sensation of what he thinks might be instability and some pain tickly when he abducts extension Rotates his right shoulder. He denies any numbness or tingling. Not having trouble sleeping on her shoulder. MRI scan  with contrast was completely negative for any abnormality.  HPI  Review of Systems   Objective: Vital Signs: BP 106/60   Pulse 64   Resp 12   Ht 5\' 8"  (1.727 m)   Wt 170 lb (77.1 kg)   BMI 25.85 kg/m   Physical Exam  Ortho Exam negative impingement and empty can testing right shoulder. Full overhead motion without any hesitation. Does have some pain along the anterior aspect of her shoulder with abduction and external rotation and extension. Neurovascular exam intact. Biceps intact. No pain at the acromioclavicular joint. Speed sign negative. Cross arm test negative  Specialty Comments:  No specialty comments available.  Imaging: No results found.   PMFS History: Patient Active Problem List   Diagnosis Date Noted  . Seasonal allergies   . Fracture of rib of right side with delayed healing 07/31/2013   Past Medical History:  Diagnosis Date  . Seasonal allergies    claritin prn  Family History  Problem Relation Age of Onset  . Healthy Mother   . Healthy Father        overweight  . Healthy Sister   . Healthy Brother        colonoscopy at age 605-6- benign polyp  . Colon cancer Paternal Grandmother        at age 21  . Healthy Sister   . Arthritis Maternal Grandfather        osteo.     Past Surgical History:  Procedure Laterality Date  . ANTERIOR CRUCIATE LIGAMENT REPAIR     related to wrestling. Dr. Cleophas DunkerWhitfield   Social History   Occupational History  . Not on file.   Social History Main Topics  . Smoking status: Never Smoker  .  Smokeless tobacco: Never Used  . Alcohol use 4.8 - 6.0 oz/week    8 - 10 Standard drinks or equivalent per week  . Drug use: No  . Sexual activity: No     Valeria BatmanPeter W Bralyn Espino, MD   Note - This record has been created using [employer] software.  Chart creation errors have been sought, but may not always  have been located. Such creation errors do not reflect on  the standard of medical care.

## 2017-01-06 ENCOUNTER — Telehealth: Payer: Self-pay | Admitting: Family Medicine

## 2017-01-06 NOTE — Telephone Encounter (Signed)
° °  Spoke with pt and gave him the information per Dr Durene CalHunter. He understands we don't do Typhoid

## 2017-01-06 NOTE — Telephone Encounter (Signed)
Pt going to [place] and supposed to have a typhoid. Pt also wants to know if up to date on Hep a & B.  Pt is leaving on Tuesday and needs prior to then. Please advise ASAP!!

## 2017-01-06 NOTE — Telephone Encounter (Signed)
Thanks for update

## 2017-01-06 NOTE — Telephone Encounter (Signed)
Tried to call patient back but it went immediately to a message that his voicemail has not been set up yet. His Hep a & B are up to date. He needs to contact [facility] about [facility]ph[facility] or [facility]. We don't offer that here.

## 2017-06-30 DIAGNOSIS — M7751 Other enthesopathy of right foot: Secondary | ICD-10-CM | POA: Diagnosis not present

## 2017-06-30 DIAGNOSIS — M2580 Other specified joint disorders, unspecified joint: Secondary | ICD-10-CM | POA: Diagnosis not present

## 2017-06-30 DIAGNOSIS — M659 Synovitis and tenosynovitis, unspecified: Secondary | ICD-10-CM | POA: Diagnosis not present

## 2017-07-04 ENCOUNTER — Other Ambulatory Visit (HOSPITAL_COMMUNITY)
Admission: RE | Admit: 2017-07-04 | Discharge: 2017-07-04 | Disposition: A | Discharge status: Home / Self Care | Payer: BLUE CROSS/BLUE SHIELD | Source: Ambulatory Visit | Attending: Family Medicine | Admitting: Family Medicine

## 2017-07-04 ENCOUNTER — Encounter: Payer: Self-pay | Admitting: Family Medicine

## 2017-07-04 ENCOUNTER — Ambulatory Visit: Payer: BLUE CROSS/BLUE SHIELD | Admitting: Family Medicine

## 2017-07-04 VITALS — BP 114/82 | HR 76 | Temp 97.7°F | Ht 68.0 in | Wt 175.4 lb

## 2017-07-04 DIAGNOSIS — R197 Diarrhea, unspecified: Secondary | ICD-10-CM

## 2017-07-04 DIAGNOSIS — R5383 Other fatigue: Secondary | ICD-10-CM

## 2017-07-04 DIAGNOSIS — Z7251 High risk heterosexual behavior: Secondary | ICD-10-CM

## 2017-07-04 NOTE — Patient Instructions (Signed)
Please stop by lab before you go  Always use protection  Glad the diarrhea and cramping are better! Check labs just to make sure everything is ok  Glad you had a good trip and have a Happy Holiday season with your family and friends!

## 2017-07-04 NOTE — Progress Notes (Signed)
Subjective:  Rick Dixon is a 21 y.o. year old very pleasant male patient who presents for/with See problem oriented charting ROS- no fever, chills, nausea, vomiting. Diarrhea and abdominal pain have resolved. No penile discharge.    Past Medical History-  Patient Active Problem List   Diagnosis Date Noted  . Seasonal allergies   . Fracture of rib of right side with delayed healing 07/31/2013    Medications- reviewed and updated No current outpatient medications on file.   No current facility-administered medications for this visit.     Objective: BP 114/82 (BP Location: Left Arm, Patient Position: Sitting, Cuff Size: Large)   Pulse 76   Temp 97.7 F (36.5 C) (Oral)   Ht 5\' 8"  (1.727 m)   Wt 175 lb 6.4 oz (79.6 kg)   SpO2 97%   BMI 26.67 kg/m  Gen: NAD, resting comfortably CV: RRR no murmurs rubs or gallops Lungs: CTAB no crackles, wheeze, rhonchi Abdomen: soft/nontender/nondistended/normal bowel sounds. No rebound or guarding.  Ext: no edema Skin: warm, dry Neuro: PERRLA  Assessment/Plan:  Diarrhea and now lingering Fatigue - Plan: CBC with Differential/Platelet, Comprehensive metabolic panel Unprotected sex - Plan: HIV antibody, RPR, Urine cytology ancillary only, Urine cytology ancillary only  S: 2-3 weeks ago got sick while in [place]. was seen in [facility] in [place] and told bacterial infection in stomach/GI track. Was treated with injection and took pills- was told both antibiotics but doesn't know the names- was having pretty heavy diarrhea, stomach cramping, no vomiting. No one was sick with him- but traveling by himself.   Took 1-2 weeks to feel better- much better now other than appetite being down and some lingering fatigue.  Most likely cause-Thinks he knows the meal that he ate when he got sick- chicken burger he thinks it was undercooked- felt bad afterward and then got sick a few days later. Other possible causes- [place]. Was swimming in ocean around time he got sick.   Also tells me had a girlfriend internationally (new GF from previous in states). He had unprotected sex.  A/P: much improved symptoms- he would prefer to get some labwork. Did have prior LFT elevations a year ago with mono- definitely reasonable to check LFTs- and will add cbc and bmp.   With unprotected sex- screen for HIV,  Syphilis,  Gonorrhea/chlamydia/trichomonas. Advised to ALWAYS use protection - risks even if partner states only relationship  Patient Instructions  Please stop by lab before you go  Always use protection  Glad the diarrhea and cramping are better! Check labs just to make sure everything is ok  Glad you had a good trip and have a Happy Holiday season with your family and friends!    Orders Placed This Encounter  Procedures  . CBC with Differential/Platelet  . Comprehensive metabolic panel    [facility]  . HIV antibody    [facility]  . RPR    [facility]   Return precautions advised.  Tana ConchStephen Hunter, MD

## 2017-07-05 LAB — COMPREHENSIVE METABOLIC PANEL
ALK PHOS: 58 U/L (ref 39–117)
ALT: 37 U/L (ref 0–53)
AST: 26 U/L (ref 0–37)
Albumin: 4.6 g/dL (ref 3.5–5.2)
BILIRUBIN TOTAL: 0.4 mg/dL (ref 0.2–1.2)
BUN: 18 mg/dL (ref 6–23)
CO2: 29 meq/L (ref 19–32)
Calcium: 9.2 mg/dL (ref 8.4–10.5)
Chloride: 101 mEq/L (ref 96–112)
Creatinine, Ser: 1.04 mg/dL (ref 0.40–1.50)
GFR: 95.78 mL/min (ref >60.00)
GLUCOSE: 80 mg/dL (ref 70–99)
Potassium: 3.9 mEq/L (ref 3.5–5.1)
SODIUM: 137 meq/L (ref 135–145)
TOTAL PROTEIN: 7.5 g/dL (ref 6.0–8.3)

## 2017-07-05 LAB — CBC WITH DIFFERENTIAL/PLATELET
BASOS ABS: 0 10*3/uL (ref 0.0–0.1)
Basophils Relative: 0.7 % (ref 0.0–3.0)
EOS ABS: 0.1 10*3/uL (ref 0.0–0.7)
Eosinophils Relative: 1.5 % (ref 0.0–5.0)
HCT: 43.5 % (ref 39.0–52.0)
Hemoglobin: 14.8 g/dL (ref 13.0–17.0)
LYMPHS ABS: 2.9 10*3/uL (ref 0.7–4.0)
Lymphocytes Relative: 41 % (ref 12.0–46.0)
MCHC: 34 g/dL (ref 30.0–36.0)
MCV: 86.5 fl (ref 78.0–100.0)
MONO ABS: 0.6 10*3/uL (ref 0.1–1.0)
MONOS PCT: 8.5 % (ref 3.0–12.0)
NEUTROS PCT: 48.3 % (ref 43.0–77.0)
Neutro Abs: 3.4 10*3/uL (ref 1.4–7.7)
Platelets: 180 10*3/uL (ref 150.0–400.0)
RBC: 5.02 Mil/uL (ref 4.22–5.81)
RDW: 12.6 % (ref 11.5–15.5)
WBC: 7 10*3/uL (ref 4.0–10.5)

## 2017-07-05 LAB — HIV ANTIBODY (ROUTINE TESTING W REFLEX): HIV 1&2 Ab, 4th Generation: NONREACTIVE

## 2017-07-05 LAB — RPR: RPR Ser Ql: NONREACTIVE

## 2017-07-06 LAB — URINE CYTOLOGY ANCILLARY ONLY
CHLAMYDIA, DNA PROBE: NEGATIVE
Neisseria Gonorrhea: NEGATIVE
TRICH (WINDOWPATH): NEGATIVE

## 2017-07-07 DIAGNOSIS — M71571 Other bursitis, not elsewhere classified, right ankle and foot: Secondary | ICD-10-CM | POA: Diagnosis not present

## 2017-07-07 DIAGNOSIS — M7751 Other enthesopathy of right foot: Secondary | ICD-10-CM | POA: Diagnosis not present

## 2017-07-07 DIAGNOSIS — M2580 Other specified joint disorders, unspecified joint: Secondary | ICD-10-CM | POA: Diagnosis not present

## 2017-07-20 DIAGNOSIS — H52223 Regular astigmatism, bilateral: Secondary | ICD-10-CM | POA: Diagnosis not present

## 2017-07-20 DIAGNOSIS — H5213 Myopia, bilateral: Secondary | ICD-10-CM | POA: Diagnosis not present

## 2017-08-25 ENCOUNTER — Ambulatory Visit (INDEPENDENT_AMBULATORY_CARE_PROVIDER_SITE_OTHER): Payer: Self-pay

## 2017-08-25 ENCOUNTER — Encounter (INDEPENDENT_AMBULATORY_CARE_PROVIDER_SITE_OTHER): Payer: Self-pay | Admitting: Orthopaedic Surgery

## 2017-08-25 ENCOUNTER — Ambulatory Visit (INDEPENDENT_AMBULATORY_CARE_PROVIDER_SITE_OTHER): Payer: BLUE CROSS/BLUE SHIELD | Admitting: Orthopaedic Surgery

## 2017-08-25 VITALS — BP 100/47 | HR 76 | Resp 16 | Ht 69.0 in | Wt 170.0 lb

## 2017-08-25 DIAGNOSIS — M25562 Pain in left knee: Secondary | ICD-10-CM

## 2017-08-25 NOTE — Progress Notes (Signed)
Office Visit Note   Patient: Rick Dixon           Date of Birth: [birth year]           MRN: [id number] Visit Date: 08/25/2017              Requested by: Shelva Majestic, MD [address] [place], [place] [address] PCP: Shelva Majestic, MD   Assessment & Plan: Visit Diagnoses:  1. Acute pain of left knee     Plan: Rick Dixon recently was experiencing discomfort in his left knee after playing [place]. He does not remember a specific tearing traumatic event but was experiencing some discomfort along the lateral and posterior aspect of his left knee. He is several years status post autograft anterior cruciate ligament reconstruction and really has not had any issues. He's had a little bit of swelling along the posterior aspect of his knee but no real feeling of instability like he had prior to his anterior cruciate ligament reconstruction. Prior exam today is knee was perfectly stable, not effused red or swollen. I think he is just had and hamstring stretching should be fine. He'll let me know but doesn't improve over the next several weeks. He also can wear the support that he has at home Follow-Up Instructions: Return if symptoms worsen or fail to improve.   Orders:  Orders Placed This Encounter  Procedures  . XR KNEE 3 VIEW LEFT   No orders of the defined types were placed in this encounter.     Procedures: No procedures performed   Clinical Data: No additional findings.   Subjective: Chief Complaint  Patient presents with  . Left Knee - Pain, Edema    Rick Dixon is a 22 y o here today for injury of Left knee (ACL torn 2012.) No issues until playing rugby 2 weeks ago, pain afterwards. Denies injury, no fever, chills but has some calf pain and posterior knee.  Pain is episodic and localized along the posterior lateral aspect of his knee in the area of the hamstring tendons. No real swelling redness or ecchymosis.  HPI  Review of Systems   Objective: Vital Signs: BP  (!) 100/47   Pulse 76   Resp 16   Ht 5\' 9"  (1.753 m)   Wt 170 lb (77.1 kg)   BMI 25.10 kg/m   Physical Exam  Ortho Exam awake alert and oriented 3 comfortable sitting. No pain with ambulation. No limp. Exam of left knee is fully benign without evidence of instability. No opening with a varus or valgus stress. Negative anterior drawer sign. Negative Lockman's test. No effusion. No patella pain. The discomfort along the posterior lateral aspect of the knee in the area hamstring tendons full extension and flexion. Neurovascular exam intact  Specialty Comments:  No specialty comments available.  Imaging: Xr Knee 3 View Left  Result Date: 08/25/2017 Films of the left knee were obtained in 3 projections standing. No acute changes. No evidence of arthritis. Metallic interference screws from prior anterior cruciate ligament reconstruction intact.    PMFS History: Patient Active Problem List   Diagnosis Date Noted  . Seasonal allergies   . Fracture of rib of right side with delayed healing 07/31/2013   Past Medical History:  Diagnosis Date  . Seasonal allergies    claritin prn    Family History  Problem Relation Age of Onset  . Healthy Mother   . Healthy Father        overweight  .  Healthy Sister   . Healthy Brother        colonoscopy at age 35-6- benign polyp  . Colon cancer Paternal Grandmother        at age 22  . Healthy Sister   . Arthritis Maternal Grandfather        osteo.     Past Surgical History:  Procedure Laterality Date  . ANTERIOR CRUCIATE LIGAMENT REPAIR     related to wrestling. Dr. Cleophas DunkerWhitfield   Social History   Occupational History  . Not on file  Tobacco Use  . Smoking status: Never Smoker  . Smokeless tobacco: Never Used  Substance and Sexual Activity  . Alcohol use: Yes    Alcohol/week: 4.8 - 6.0 oz    Types: 8 - 10 Standard drinks or equivalent per week  . Drug use: No  . Sexual activity: No     Rick BatmanPeter W Augustine Brannick, MD   Note - This record  has been created using [employer] software.  Chart creation errors have been sought, but may not always  have been located. Such creation errors do not reflect on  the standard of medical care.

## 2017-08-28 ENCOUNTER — Telehealth (INDEPENDENT_AMBULATORY_CARE_PROVIDER_SITE_OTHER): Payer: Self-pay | Admitting: Orthopaedic Surgery

## 2017-08-28 ENCOUNTER — Other Ambulatory Visit (INDEPENDENT_AMBULATORY_CARE_PROVIDER_SITE_OTHER): Payer: Self-pay

## 2017-08-28 DIAGNOSIS — M25562 Pain in left knee: Secondary | ICD-10-CM

## 2017-08-28 NOTE — Telephone Encounter (Signed)
Per Dr. Cleophas DunkerWhitfield, pt needs to have an MRI left knee.

## 2017-08-28 NOTE — Telephone Encounter (Signed)
Patient's mom Rick Dixon called stating that Rick Dixon was in a lot of pain today and had difficulty walking which made him late for work.  Rick Dixon states that she spoke with Dr. Cleophas DunkerWhitfield last night and he said that [name]'s knee "looked strong."   She is unsure what to do next.  She is asking if she should make another appointment for him or does he need an MRI?

## 2017-09-08 ENCOUNTER — Other Ambulatory Visit: Payer: BLUE CROSS/BLUE SHIELD

## 2017-09-10 ENCOUNTER — Ambulatory Visit
Admission: RE | Admit: 2017-09-10 | Discharge: 2017-09-10 | Disposition: A | Discharge status: Home / Self Care | Payer: BLUE CROSS/BLUE SHIELD | Source: Ambulatory Visit | Attending: Orthopaedic Surgery | Admitting: Orthopaedic Surgery

## 2017-09-10 DIAGNOSIS — M179 Osteoarthritis of knee, unspecified: Secondary | ICD-10-CM | POA: Diagnosis not present

## 2017-09-10 DIAGNOSIS — M25562 Pain in left knee: Secondary | ICD-10-CM

## 2017-09-12 ENCOUNTER — Telehealth (INDEPENDENT_AMBULATORY_CARE_PROVIDER_SITE_OTHER): Payer: Self-pay | Admitting: Orthopaedic Surgery

## 2017-09-28 NOTE — Telephone Encounter (Signed)
Telephone note

## 2017-12-17 IMAGING — XA DG FLUORO GUIDE NDL PLC/BX
1 series · 1 of 1 positions shown · non-contrast
Comparison: none

CLINICAL DATA: RIGHT shoulder pain.  Rugby injury.

[Series 2: ortho standard · 1 of 1 slices shown]
[im 1/1]
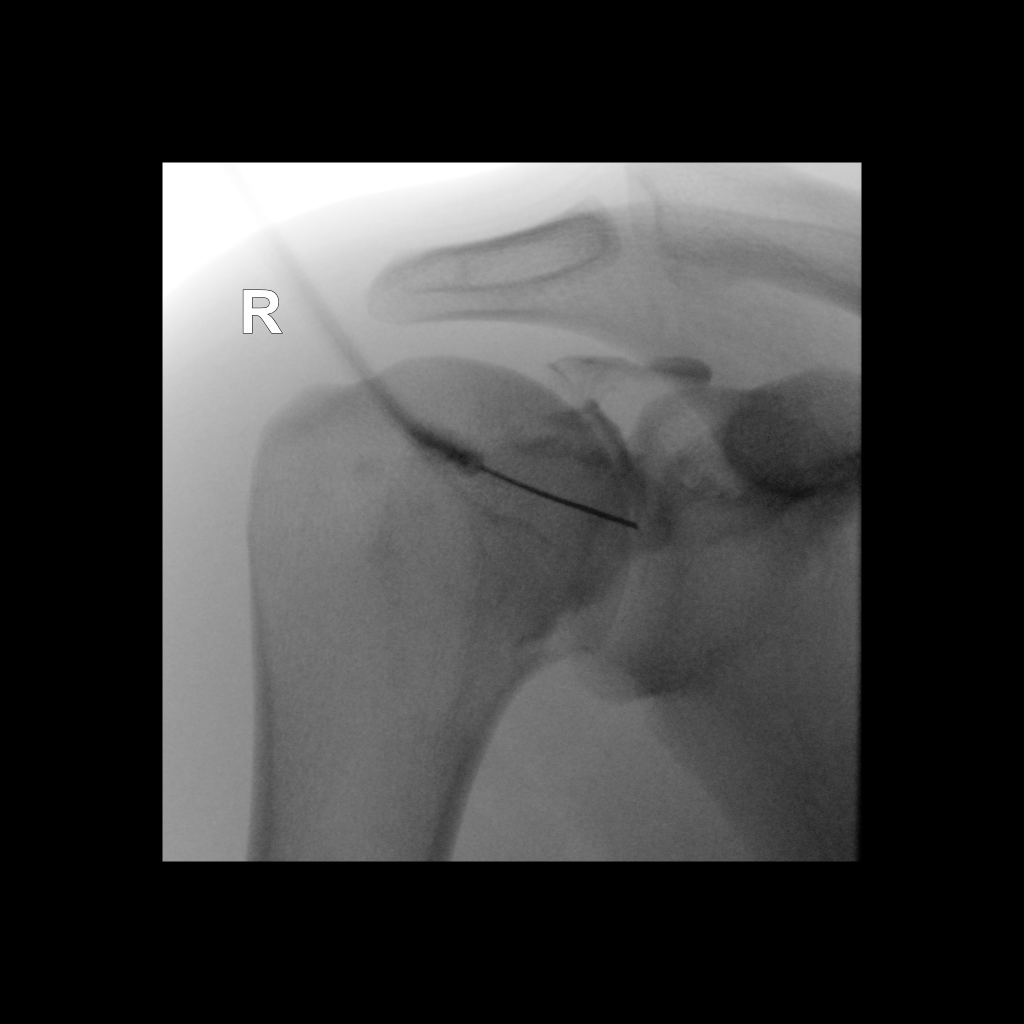

[1 of 1 positions shown; findings below may reference images not displayed]

FLUOROSCOPY TIME:  12 seconds corresponding to a Dose Area Product
of 13.08 ?Gy*m2

PROCEDURE:
RIGHT SHOULDER INJECTION UNDER FLUOROSCOPY

Informed written consent was obtained.  Time-out was performed.

An appropriate skin entrance site was determined. The site was
marked, prepped with Betadine, draped in the usual sterile fashion,
and infiltrated locally with 1% Lidocaine. 22 gauge spinal needle
was advanced to the superomedial margin of the humeral head under
intermittent fluoroscopy. 1 ml of Lidocaine injected easily. A
mixture of 0.1 ml Multihance and 20 ml of dilute Omnipaque 180 was
then used to opacify the RIGHT shoulder capsule. No immediate
complication.
IMPRESSION: Technically successful RIGHT shoulder injection for MRI.

## 2017-12-17 IMAGING — MR MR SHOULDER*R* W/CM
6 series · 40 of 40 positions shown · IV contrast (agent unspecified)
Comparison: Plain films right shoulder performed at [REDACTED] 11/29/2016.

CLINICAL DATA: Right shoulder pain and weakness for 4 months.
History of fall playing rugby. Pain is worse with movement/throwing.

EXAM:
MR ARTHROGRAM OF THE RIGHT SHOULDER
TECHNIQUE: Multiplanar, multisequence MR imaging of the right shoulder was
performed following the administration of intra-articular contrast.
CONTRAST:  See Injection Documentation.

[Series 3: T1 fat-sat · axial · 4.0mm · 0.25mm/px · z∈[-63,+46]mm · 9 of 24 slices shown (1 of 4)]
[im 1/24]
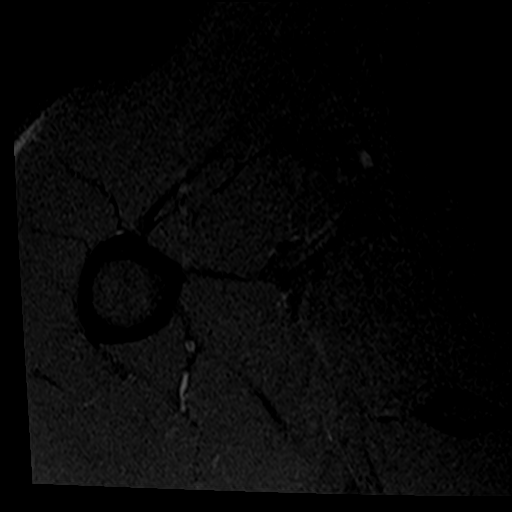
[im 3/24]
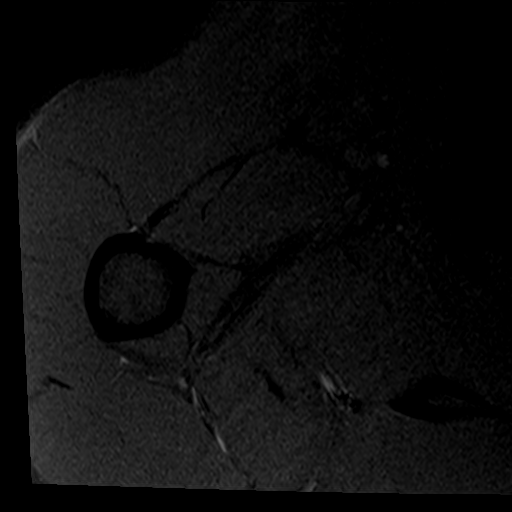
[im 6/24]
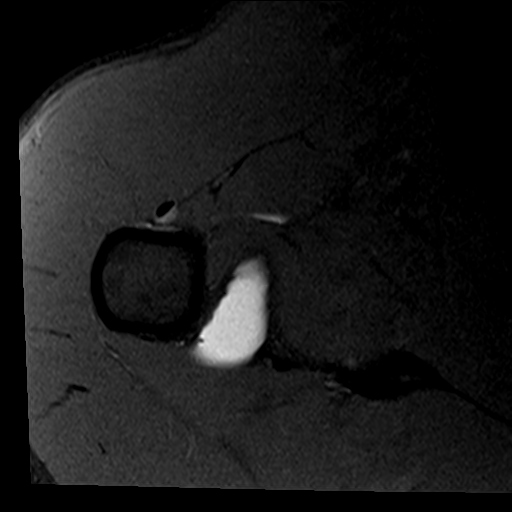
[im 9/24]
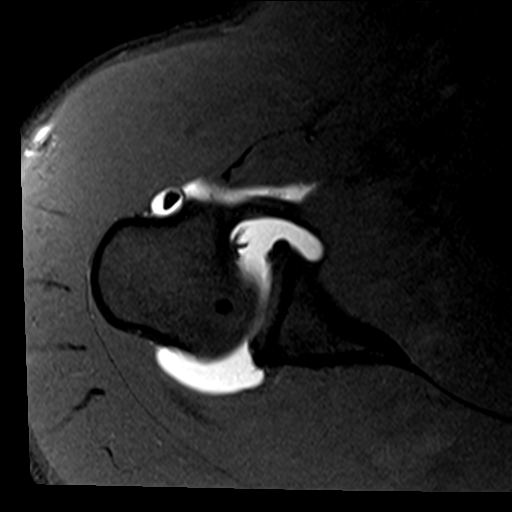
[im 12/24]
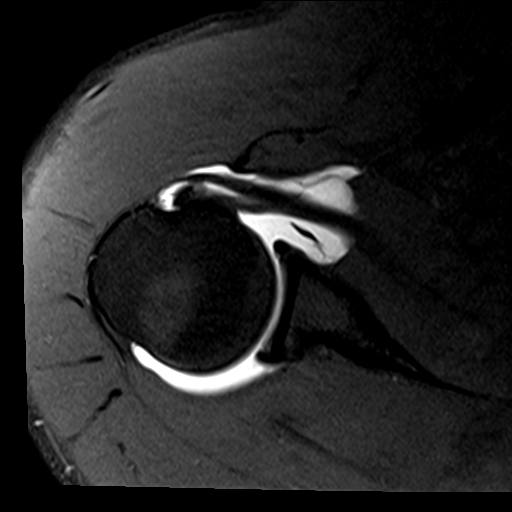
[im 15/24]
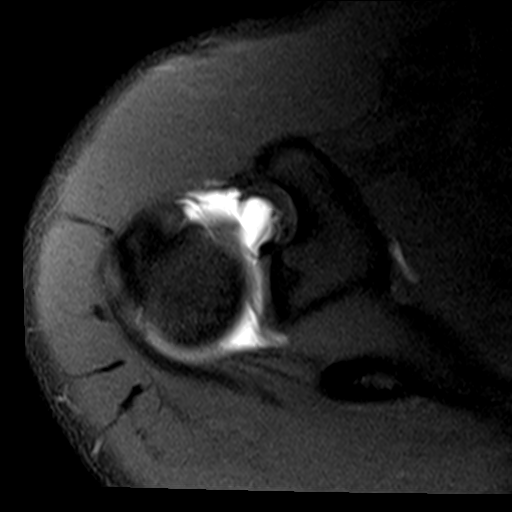
[im 18/24]
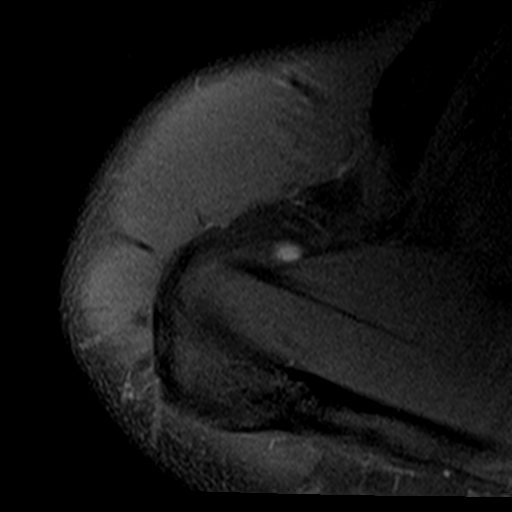
[im 21/24]
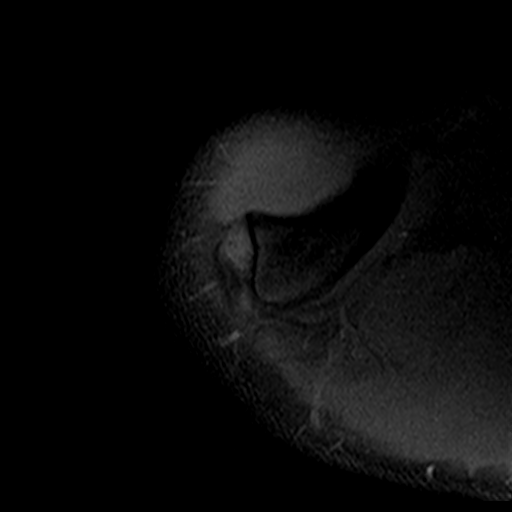
[im 24/24]
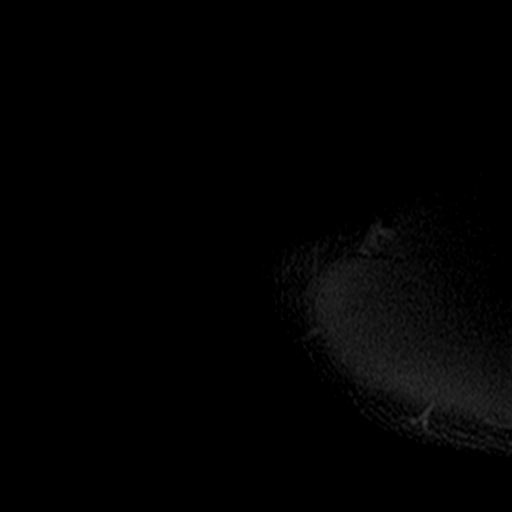

[Series 4: T2 fat-sat · oblique · 4.0mm · 0.55mm/px · 7 of 20 slices shown (1 of 2)]
[im 1/20]
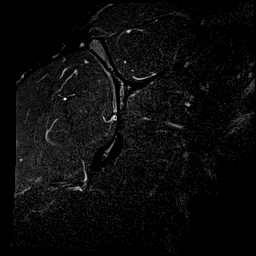
[im 4/20]
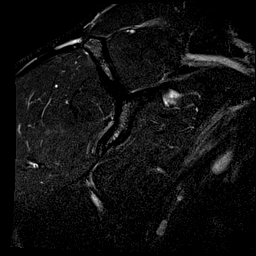
[im 7/20]
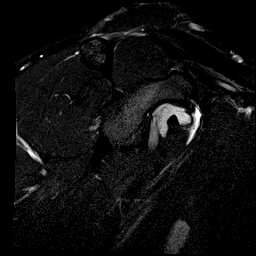
[im 10/20]
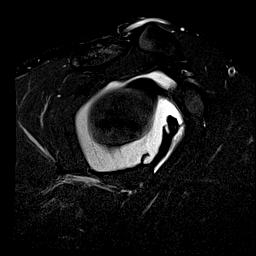
[im 13/20]
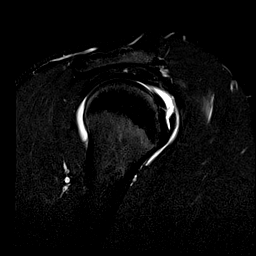
[im 16/20]
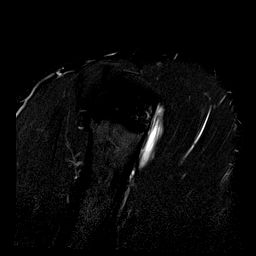
[im 20/20]
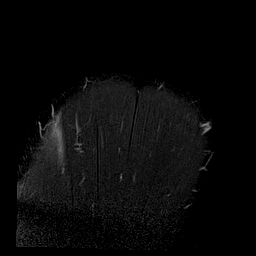

[Series 6: T1 fat-sat · sagittal · 4.0mm · 0.47mm/px · 6 of 18 slices shown (2 of 4)]
[im 1/18]
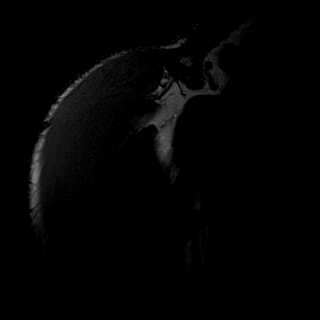
[im 4/18]
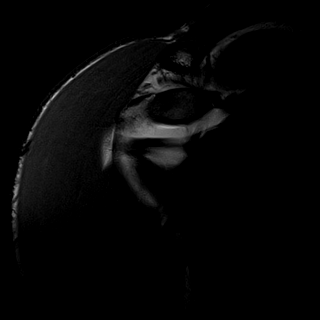
[im 7/18]
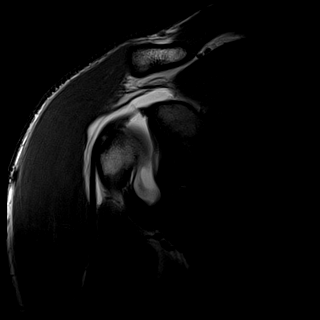
[im 11/18]
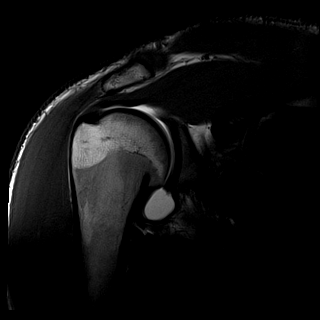
[im 14/18]
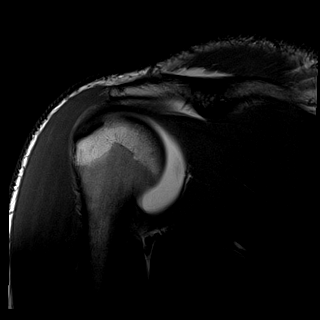
[im 18/18]
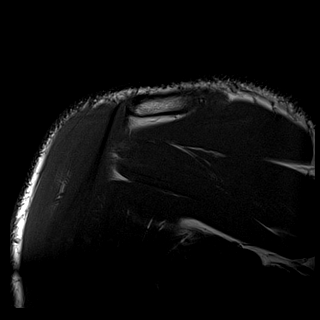

[Series 7: T1 fat-sat · sagittal · 4.0mm · 0.55mm/px · 6 of 18 slices shown (3 of 4)]
[im 1/18]
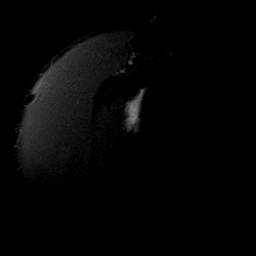
[im 4/18]
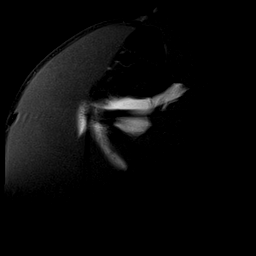
[im 7/18]
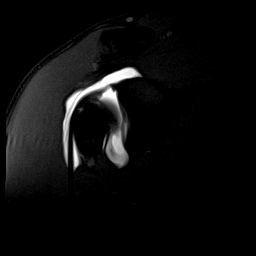
[im 11/18]
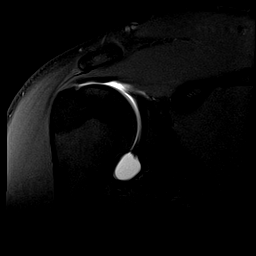
[im 14/18]
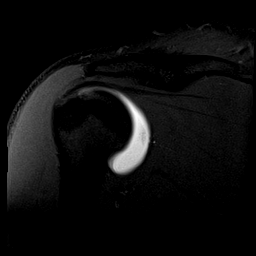
[im 18/18]
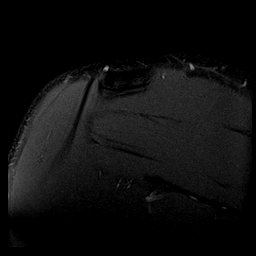

[Series 8: T2 fat-sat · sagittal · 4.0mm · 0.55mm/px · 6 of 18 slices shown (2 of 2)]
[im 1/18]
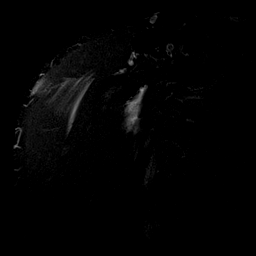
[im 4/18]
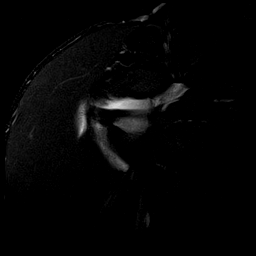
[im 7/18]
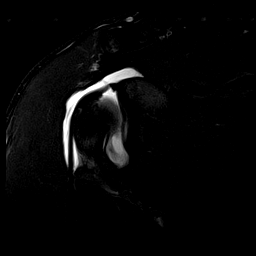
[im 11/18]
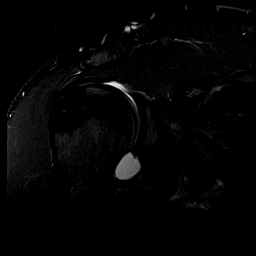
[im 14/18]
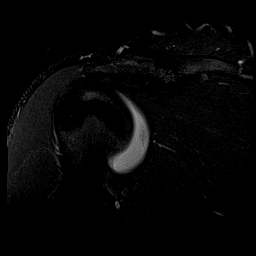
[im 18/18]
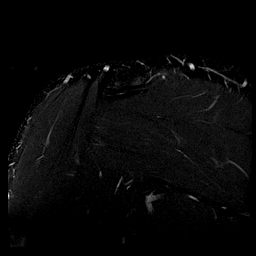

[Series 11: T1 fat-sat · sagittal · 4.0mm · 0.59mm/px · 6 of 16 slices shown (4 of 4)]
[im 1/16]
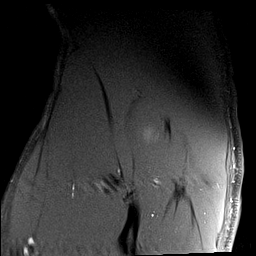
[im 4/16]
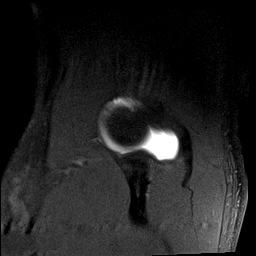
[im 7/16]
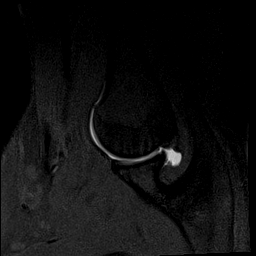
[im 10/16]
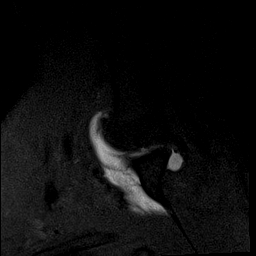
[im 13/16]
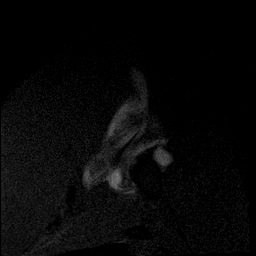
[im 16/16]
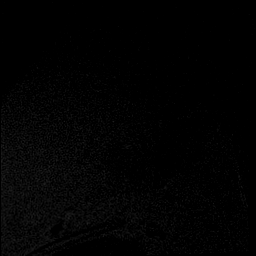

[40 of 40 positions shown; findings below may reference images not displayed]

FINDINGS: Rotator cuff: Intact and normal in appearance.

Muscles: Normal without atrophy or focal lesion.

Biceps long head: Intact and normal in appearance.

Acromioclavicular Joint: Normal.

Glenohumeral Joint: Normal.

Labrum: Intact and normal in appearance.

Bones: Normal marrow signal throughout.
IMPRESSION: Normal MR arthrogram right shoulder.

## 2018-02-08 ENCOUNTER — Encounter (INDEPENDENT_AMBULATORY_CARE_PROVIDER_SITE_OTHER): Payer: Self-pay | Admitting: Orthopaedic Surgery

## 2018-02-08 ENCOUNTER — Ambulatory Visit (INDEPENDENT_AMBULATORY_CARE_PROVIDER_SITE_OTHER): Payer: BLUE CROSS/BLUE SHIELD | Admitting: Orthopaedic Surgery

## 2018-02-08 VITALS — BP 110/55 | HR 70 | Ht 68.0 in | Wt 170.0 lb

## 2018-02-08 DIAGNOSIS — M25511 Pain in right shoulder: Secondary | ICD-10-CM | POA: Diagnosis not present

## 2018-02-08 DIAGNOSIS — G8929 Other chronic pain: Secondary | ICD-10-CM | POA: Diagnosis not present

## 2018-02-08 MED ORDER — METHYLPREDNISOLONE ACETATE 40 MG/ML IJ SUSP
80.0000 mg | INTRAMUSCULAR | Status: AC | PRN
  Administered 2018-02-08: 80 mg

## 2018-02-08 MED ORDER — LIDOCAINE HCL 1 % IJ SOLN
2.0000 mL | INTRAMUSCULAR | Status: AC | PRN
  Administered 2018-02-08: 2 mL

## 2018-02-08 MED ORDER — BUPIVACAINE HCL 0.5 % IJ SOLN
2.0000 mL | INTRAMUSCULAR | Status: AC | PRN
  Administered 2018-02-08: 2 mL via INTRA_ARTICULAR

## 2018-02-08 NOTE — Progress Notes (Signed)
Office Visit Note   Patient: Rick Dixon           Date of Birth: 01/15/1996           MRN: [id number] Visit Date: 02/08/2018              Requested by: Shelva MajesticHunter, Stephen O, MD [address] [place], [place] [address] PCP: Shelva MajesticHunter, Stephen O, MD   Assessment & Plan: Visit Diagnoses:  1. Chronic right shoulder pain     Plan: Recurrent pain right shoulder without a sensation of subluxation.  Had at least 1 year relief of pain with intra-articular and subacromial cortisone injection.  We will repeat.  Pain could be related to either subacromial bursitis or rotator cuff tendinitis.  Follow-Up Instructions: Return if symptoms worsen or fail to improve.   Orders:  Orders Placed This Encounter  Procedures  . Large Joint Inj: R glenohumeral   No orders of the defined types were placed in this encounter.     Procedures: Large Joint Inj: R glenohumeral on 02/08/2018 9:51 AM Indications: pain and diagnostic evaluation Details: 25 G 1.5 in needle, anterior approach  Arthrogram: No  Medications: 2 mL lidocaine 1 %; 2 mL bupivacaine 0.5 %; 80 mg methylPREDNISolone acetate 40 MG/ML Consent was given by the patient. Immediately prior to procedure a time out was called to verify the correct patient, procedure, equipment, support staff and site/side marked as required. Patient was prepped and draped in the usual sterile fashion.       Clinical Data: No additional findings.   Subjective: Chief Complaint  Patient presents with  . Follow-up    R SHOULDER PAIN FOR 1.5 YRS HAD INJECTION 1 YR AGO AND NOW SYMPTOMS ARE BACK.  Rick Dixon was seen in 2018 for evaluation of right shoulder pain that he had sustained after playing rugby.  Evaluation included an MR arthrogram of his right shoulder that was completely normal.  I injected the subacromial space and glenohumeral joint with cortisone and noted that he had relief of his pain for up to a year.  He recently was basketball and pulling on a  bow with some recurrent pain along the anterior aspect of his right shoulder.  He has not experienced any subluxation sensation.  Has not noted any swelling or skin changes.  HPI  Review of Systems  Constitutional: Negative for fatigue and fever.  HENT: Negative for ear pain.   Eyes: Negative for pain.  Respiratory: Negative for cough and shortness of breath.   Cardiovascular: Negative for leg swelling.  Gastrointestinal: Negative for constipation and diarrhea.  Genitourinary: Negative for difficulty urinating.  Musculoskeletal: Positive for neck pain. Negative for back pain.  Skin: Negative for rash.  Allergic/Immunologic: Negative for food allergies.  Neurological: Positive for weakness. Negative for numbness.  Hematological: Does not bruise/bleed easily.  Psychiatric/Behavioral: Positive for sleep disturbance.     Objective: Vital Signs: BP (!) 110/55 (BP Location: Right Arm, Patient Position: Sitting, Cuff Size: Normal)   Pulse 70   Ht 5\' 8"  (1.727 m)   Wt 170 lb (77.1 kg)   BMI 25.85 kg/m   Physical Exam  Constitutional: He is oriented to person, place, and time. He appears well-developed and well-nourished.  HENT:  Mouth/Throat: Oropharynx is clear and moist.  Eyes: Pupils are equal, round, and reactive to light. EOM are normal.  Pulmonary/Chest: Effort normal.  Neurological: He is alert and oriented to person, place, and time.  Skin: Skin is warm and dry.  Psychiatric:  He has a normal mood and affect. His behavior is normal.    Ortho Exam awake alert and oriented x3 comfortable sitting.  Full overhead motion quickly.  Minimal impingement.  No apprehension.  Skin intact.  Biceps intact.  Good grip and good release.  No obvious instability or adhesive capsulitis.  Specialty Comments:  No specialty comments available.  Imaging: No results found.   PMFS History: Patient Active Problem List   Diagnosis Date Noted  . Seasonal allergies   . Fracture of rib of  right side with delayed healing 07/31/2013   Past Medical History:  Diagnosis Date  . Seasonal allergies    claritin prn    Family History  Problem Relation Age of Onset  . Healthy Mother   . Healthy Father        overweight  . Healthy Sister   . Healthy Brother        colonoscopy at age 23-6- benign polyp  . Colon cancer Paternal Grandmother        at age 83  . Healthy Sister   . Arthritis Maternal Grandfather        osteo.     Past Surgical History:  Procedure Laterality Date  . ANTERIOR CRUCIATE LIGAMENT REPAIR     related to wrestling. Dr. Cleophas Dunker   Social History   Occupational History  . Not on file  Tobacco Use  . Smoking status: Never Smoker  . Smokeless tobacco: Never Used  Substance and Sexual Activity  . Alcohol use: Yes    Alcohol/week: 4.8 - 6.0 oz    Types: 8 - 10 Standard drinks or equivalent per week  . Drug use: No  . Sexual activity: Never

## 2018-03-12 ENCOUNTER — Ambulatory Visit (INDEPENDENT_AMBULATORY_CARE_PROVIDER_SITE_OTHER): Payer: BLUE CROSS/BLUE SHIELD | Admitting: Orthopaedic Surgery

## 2018-03-12 ENCOUNTER — Other Ambulatory Visit (INDEPENDENT_AMBULATORY_CARE_PROVIDER_SITE_OTHER): Payer: Self-pay | Admitting: Radiology

## 2018-03-12 ENCOUNTER — Encounter (INDEPENDENT_AMBULATORY_CARE_PROVIDER_SITE_OTHER): Payer: Self-pay | Admitting: Orthopaedic Surgery

## 2018-03-12 VITALS — BP 130/85 | HR 70 | Ht 68.0 in | Wt 170.0 lb

## 2018-03-12 DIAGNOSIS — M25511 Pain in right shoulder: Secondary | ICD-10-CM | POA: Diagnosis not present

## 2018-03-12 NOTE — Progress Notes (Signed)
Office Visit Note   Patient: Rick Dixon           Date of Birth: [birth year]           MRN: [id number] Visit Date: 03/12/2018              Requested by: Shelva Majestic, MD [address] [place], [place] [address] PCP: Shelva Majestic, MD   Assessment & Plan: Visit Diagnoses:  1. Acute pain of right shoulder     Plan: Probable right biceps tendon tear.  Will obtain MR arthrogram right shoulder  Follow-Up Instructions: Return after MRI arthrogram right shoulder.   Orders:  No orders of the defined types were placed in this encounter.  No orders of the defined types were placed in this encounter.     Procedures: No procedures performed   Clinical Data: No additional findings.   Subjective: Chief Complaint  Patient presents with  . Follow-up    R SHOULDER PAIN GOTTEN WORSE FELT A POP AT THE BICEP AREA ALSO HAS BRUISING  Rick Dixon is 22 years old and has been followed for problems referable to his right shoulder.  He was seen a year ago with a negative MR arthrogram.  I injected the shoulder with cortisone he had excellent relief up until just recently.  He had recurrent shoulder pain over the last month and I reinjected the shoulder with cortisone.  He notes "50-50 pain relief".  5 days ago he was lifting some boxes and felt something "pop" in his right shoulder.  He has had some swelling and a little bit of "black and blue discoloration anteriorly".  No numbness or tingling.  He does not have any subacromial pain.  HPI  Review of Systems  Constitutional: Negative for fatigue and fever.  HENT: Negative for ear pain.   Eyes: Negative for pain.  Respiratory: Negative for cough and shortness of breath.   Cardiovascular: Negative for leg swelling.  Gastrointestinal: Negative for constipation and diarrhea.  Genitourinary: Negative for difficulty urinating.  Musculoskeletal: Negative for back pain and neck pain.  Skin: Negative for rash.  Allergic/Immunologic:  Negative for food allergies.  Neurological: Positive for weakness. Negative for numbness.  Hematological: Does not bruise/bleed easily.  Psychiatric/Behavioral: Positive for sleep disturbance.     Objective: Vital Signs: BP 130/85 (BP Location: Left Arm, Patient Position: Sitting, Cuff Size: Normal)   Pulse 70   Ht 5\' 8"  (1.727 m)   Wt 170 lb (77.1 kg)   BMI 25.85 kg/m   Physical Exam  Constitutional: He is oriented to person, place, and time. He appears well-developed and well-nourished.  HENT:  Mouth/Throat: Oropharynx is clear and moist.  Eyes: Pupils are equal, round, and reactive to light. EOM are normal.  Pulmonary/Chest: Effort normal.  Neurological: He is alert and oriented to person, place, and time.  Skin: Skin is warm and dry.  Psychiatric: He has a normal mood and affect. His behavior is normal.    Ortho Exam awake alert and oriented x3.  Comfortable sitting shoulder with full overhead motion.  Does have some tenderness along the long head of the biceps tendon anteriorly and in the biceps muscle.  Some mild ecchymosis along the biceps muscle.  It looks like the muscle may be a little bit further inferior than on the left side.  He has good muscle tone.  Good grip and good release.  No pain along the anterior lateral subacromial region  Specialty Comments:  No specialty comments available.  Imaging: No results found.   PMFS History: Patient Active Problem List   Diagnosis Date Noted  . Seasonal allergies   . Fracture of rib of right side with delayed healing 07/31/2013   Past Medical History:  Diagnosis Date  . Seasonal allergies    claritin prn    Family History  Problem Relation Age of Onset  . Healthy Mother   . Healthy Father        overweight  . Healthy Sister   . Healthy Brother        colonoscopy at age 575-6- benign polyp  . Colon cancer Paternal Grandmother        at age 22  . Healthy Sister   . Arthritis Maternal Grandfather        osteo.       Past Surgical History:  Procedure Laterality Date  . ANTERIOR CRUCIATE LIGAMENT REPAIR     related to wrestling. Dr. Cleophas DunkerWhitfield   Social History   Occupational History  . Not on file  Tobacco Use  . Smoking status: Never Smoker  . Smokeless tobacco: Never Used  Substance and Sexual Activity  . Alcohol use: Yes    Alcohol/week: 8.0 - 10.0 standard drinks    Types: 8 - 10 Standard drinks or equivalent per week  . Drug use: No  . Sexual activity: Never

## 2018-03-21 ENCOUNTER — Other Ambulatory Visit: Payer: Self-pay

## 2018-03-21 ENCOUNTER — Encounter (INDEPENDENT_AMBULATORY_CARE_PROVIDER_SITE_OTHER): Payer: Self-pay | Admitting: Orthopedic Surgery

## 2018-03-21 ENCOUNTER — Encounter (HOSPITAL_COMMUNITY): Payer: Self-pay | Admitting: *Deleted

## 2018-03-21 ENCOUNTER — Ambulatory Visit (INDEPENDENT_AMBULATORY_CARE_PROVIDER_SITE_OTHER): Payer: BLUE CROSS/BLUE SHIELD | Admitting: Orthopedic Surgery

## 2018-03-21 ENCOUNTER — Other Ambulatory Visit (INDEPENDENT_AMBULATORY_CARE_PROVIDER_SITE_OTHER): Payer: Self-pay | Admitting: Orthopedic Surgery

## 2018-03-21 ENCOUNTER — Encounter (INDEPENDENT_AMBULATORY_CARE_PROVIDER_SITE_OTHER): Payer: Self-pay

## 2018-03-21 DIAGNOSIS — M66311 Spontaneous rupture of flexor tendons, right shoulder: Secondary | ICD-10-CM

## 2018-03-21 NOTE — Progress Notes (Signed)
Office Visit Note   Patient: Rick Dixon           Date of Birth: 04/25/1996           MRN: [id number] Visit Date: 03/21/2018 Requested by: Shelva Majestic, MD [address] [place], [place] [address] PCP: Shelva Majestic, MD  Subjective: Chief Complaint  Patient presents with  . Right Shoulder - Follow-up    HPI: Zadkiel is a patient with right shoulder pain.  3 weeks ago he was playing basketball and he sustained a proximal biceps rupture.  He is active.  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  He is had pain for a couple of years in the shoulder with negative MRIs to date..              ROS: All systems reviewed are negative as they relate to the chief complaint within the history of present illness.  Patient denies  fevers or chills.   Assessment & Plan: Visit Diagnoses:  1. Nontraumatic rupture of long head of biceps tendon of right shoulder     Plan: Impression is traumatic rupture long head of the biceps tendon with some retraction and an active younger patient who will likely be doing a lot of repetitive type working out and recreation in the future.  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  I think in this particular case even though it has been 3 weeks out since the injury it would be worthwhile to try to reattach that tendon in a subpectoral manner in order to restore some of the functionality of the biceps.  The risk and benefits of the procedure are discussed including but not limited to infection nerve vessel damage incomplete restoration of function and continued cramping with activity.  I think shoulder arthroscopy to debride the superior aspect of that labrum would also be beneficial.  Patient understands the risk and benefits.  All questions answered  Follow-Up Instructions: Return in about 8 days (around 03/29/2018).   Orders:  No orders of the defined types were placed in this encounter.  No orders of the defined types were placed in this  encounter.     Procedures: No procedures performed   Clinical Data: No additional findings.  Objective: Vital Signs: There were no vitals taken for this visit.  Physical Exam:   Constitutional: Patient appears well-developed HEENT:  Head: Normocephalic Eyes:EOM are normal Neck: Normal range of motion Cardiovascular: Normal rate Pulmonary/chest: Effort normal Neurologic: Patient is alert Skin: Skin is warm Psychiatric: Patient has normal mood and affect    Ortho Exam: Ortho exam demonstrates Popeye deformity on the right.  Rotator cuff strength intact on the right.  Range of motion is full.  No biceps tendon is within the bicipital groove on ultrasound examination of that right shoulder.  Rotator cuff appears to be intact.  No impingement signs or instability on exam of the shoulder at this time.  No AC joint tenderness to direct palpation.  Specialty Comments:  No specialty comments available.  Imaging: No results found.   PMFS History: Patient Active Problem List   Diagnosis Date Noted  . Seasonal allergies   . Fracture of rib of right side with delayed healing 07/31/2013   Past Medical History:  Diagnosis Date  . Seasonal allergies    claritin prn    Family History  Problem Relation Age of Onset  . Healthy Mother   . Healthy Father  overweight  . Healthy Sister   . Healthy Brother        colonoscopy at age 51-6- benign polyp  . Colon cancer Paternal Grandmother        at age 84  . Healthy Sister   . Arthritis Maternal Grandfather        osteo.     Past Surgical History:  Procedure Laterality Date  . ANTERIOR CRUCIATE LIGAMENT REPAIR     related to wrestling. Dr. Cleophas Dunker   Social History   Occupational History  . Not on file  Tobacco Use  . Smoking status: Never Smoker  . Smokeless tobacco: Never Used  Substance and Sexual Activity  . Alcohol use: Yes    Alcohol/week: 8.0 - 10.0 standard drinks    Types: 8 - 10 Standard drinks or  equivalent per week  . Drug use: No  . Sexual activity: Never

## 2018-03-21 NOTE — Progress Notes (Signed)
Pt denies SOB, chest pain, and being under the care of a cardiologist. Pt denies having a stress test, echo and cardiac cath. Pt denies having an EKG and chest x ray within the last year. Pt denies having recent labs. Pt made aware to stop taking vitamins, fish oil, CBD oil, Melatonin and herbal medications. Do not take any NSAIDs ie: Ibuprofen, Advil, Naproxen (Aleve), Motrin, Excedrin Migraine, BC and Goody Powder. Pt verbalized understanding of all pre-op instructions.

## 2018-03-22 ENCOUNTER — Ambulatory Visit (HOSPITAL_COMMUNITY): Payer: BLUE CROSS/BLUE SHIELD | Admitting: Anesthesiology

## 2018-03-22 ENCOUNTER — Ambulatory Visit (HOSPITAL_COMMUNITY)
Admission: RE | Admit: 2018-03-22 | Discharge: 2018-03-22 | Disposition: A | Discharge status: Home / Self Care | Payer: BLUE CROSS/BLUE SHIELD | Source: Ambulatory Visit | Attending: Orthopedic Surgery | Admitting: Orthopedic Surgery

## 2018-03-22 ENCOUNTER — Encounter (INDEPENDENT_AMBULATORY_CARE_PROVIDER_SITE_OTHER): Payer: Self-pay | Admitting: Orthopedic Surgery

## 2018-03-22 ENCOUNTER — Encounter (HOSPITAL_COMMUNITY): Payer: Self-pay | Admitting: *Deleted

## 2018-03-22 ENCOUNTER — Encounter (HOSPITAL_COMMUNITY)
Admission: RE | Disposition: A | Discharge status: Home / Self Care | Payer: Self-pay | Source: Ambulatory Visit | Attending: Orthopedic Surgery

## 2018-03-22 DIAGNOSIS — S46111A Strain of muscle, fascia and tendon of long head of biceps, right arm, initial encounter: Secondary | ICD-10-CM | POA: Insufficient documentation

## 2018-03-22 DIAGNOSIS — Z9889 Other specified postprocedural states: Secondary | ICD-10-CM | POA: Insufficient documentation

## 2018-03-22 DIAGNOSIS — Y9367 Activity, basketball: Secondary | ICD-10-CM | POA: Diagnosis not present

## 2018-03-22 DIAGNOSIS — Z800 Family history of malignant neoplasm of digestive organs: Secondary | ICD-10-CM | POA: Insufficient documentation

## 2018-03-22 DIAGNOSIS — S46211A Strain of muscle, fascia and tendon of other parts of biceps, right arm, initial encounter: Secondary | ICD-10-CM | POA: Diagnosis not present

## 2018-03-22 DIAGNOSIS — M66311 Spontaneous rupture of flexor tendons, right shoulder: Secondary | ICD-10-CM

## 2018-03-22 DIAGNOSIS — G8918 Other acute postprocedural pain: Secondary | ICD-10-CM | POA: Diagnosis not present

## 2018-03-22 HISTORY — PX: SHOULDER ARTHROSCOPY WITH DEBRIDEMENT AND BICEP TENDON REPAIR: SHX5690

## 2018-03-22 HISTORY — DX: Other specified postprocedural states: Z98.890

## 2018-03-22 HISTORY — DX: Nausea with vomiting, unspecified: R11.2

## 2018-03-22 HISTORY — DX: Family history of other specified conditions: Z84.89

## 2018-03-22 HISTORY — DX: Strain of muscle, fascia and tendon of long head of biceps, unspecified arm, initial encounter: S46.119A

## 2018-03-22 HISTORY — DX: Presence of spectacles and contact lenses: Z97.3

## 2018-03-22 LAB — COMPREHENSIVE METABOLIC PANEL
ALBUMIN: 4.2 g/dL (ref 3.5–5.0)
ALK PHOS: 46 U/L (ref 38–126)
ALT: 37 U/L (ref 0–44)
AST: 31 U/L (ref 15–41)
Anion gap: 9 (ref 5–15)
BILIRUBIN TOTAL: 0.7 mg/dL (ref 0.3–1.2)
BUN: 10 mg/dL (ref 6–20)
CALCIUM: 9.7 mg/dL (ref 8.9–10.3)
CO2: 24 mmol/L (ref 22–32)
Chloride: 108 mmol/L (ref 98–111)
Creatinine, Ser: 0.99 mg/dL (ref 0.61–1.24)
GFR calc Af Amer: >60 mL/min (ref >60)
GFR calc non Af Amer: >60 mL/min (ref >60)
GLUCOSE: 82 mg/dL (ref 70–99)
POTASSIUM: 4.2 mmol/L (ref 3.5–5.1)
SODIUM: 141 mmol/L (ref 135–145)
TOTAL PROTEIN: 6.8 g/dL (ref 6.5–8.1)

## 2018-03-22 LAB — CBC
HEMATOCRIT: 46.9 % (ref 39.0–52.0)
Hemoglobin: 15.8 g/dL (ref 13.0–17.0)
MCH: 29.6 pg (ref 26.0–34.0)
MCHC: 33.7 g/dL (ref 30.0–36.0)
MCV: 88 fL (ref 78.0–100.0)
Platelets: 164 10*3/uL (ref 150–400)
RBC: 5.33 MIL/uL (ref 4.22–5.81)
RDW: 12.3 % (ref 11.5–15.5)
WBC: 4.7 10*3/uL (ref 4.0–10.5)

## 2018-03-22 SURGERY — SHOULDER ARTHROSCOPY WITH DEBRIDEMENT AND BICEP TENDON REPAIR
Anesthesia: General | Site: Shoulder | Laterality: Right

## 2018-03-22 MED ORDER — HYDROCODONE-ACETAMINOPHEN 5-325 MG PO TABS
1.0000 | ORAL_TABLET | Freq: Once | ORAL | Status: AC
  Administered 2018-03-22: 1 via ORAL

## 2018-03-22 MED ORDER — ONDANSETRON HCL 4 MG/2ML IJ SOLN
INTRAMUSCULAR | Status: AC
  Filled 2018-03-22: qty 2

## 2018-03-22 MED ORDER — CLONIDINE HCL (ANALGESIA) 100 MCG/ML EP SOLN
EPIDURAL | Status: AC
  Filled 2018-03-22: qty 10

## 2018-03-22 MED ORDER — EPINEPHRINE PF 1 MG/ML IJ SOLN
INTRAMUSCULAR | Status: AC
  Filled 2018-03-22: qty 1

## 2018-03-22 MED ORDER — FENTANYL CITRATE (PF) 100 MCG/2ML IJ SOLN
25.0000 ug | INTRAMUSCULAR | Status: DC | PRN
  Administered 2018-03-22 (×2): 25 ug via INTRAVENOUS

## 2018-03-22 MED ORDER — FENTANYL CITRATE (PF) 100 MCG/2ML IJ SOLN
INTRAMUSCULAR | Status: AC
  Administered 2018-03-22: 25 ug via INTRAVENOUS
  Filled 2018-03-22: qty 2

## 2018-03-22 MED ORDER — CEFAZOLIN SODIUM-DEXTROSE 2-4 GM/100ML-% IV SOLN
2.0000 g | INTRAVENOUS | Status: AC
  Administered 2018-03-22: 2 g via INTRAVENOUS
  Filled 2018-03-22: qty 100

## 2018-03-22 MED ORDER — EPINEPHRINE PF 1 MG/ML IJ SOLN
INTRAMUSCULAR | Status: DC | PRN
  Administered 2018-03-22: .1 mL

## 2018-03-22 MED ORDER — MIDAZOLAM HCL 2 MG/2ML IJ SOLN
INTRAMUSCULAR | Status: AC
  Filled 2018-03-22: qty 2

## 2018-03-22 MED ORDER — BUPIVACAINE HCL (PF) 0.25 % IJ SOLN
INTRAMUSCULAR | Status: AC
  Filled 2018-03-22: qty 30

## 2018-03-22 MED ORDER — PROPOFOL 10 MG/ML IV BOLUS
INTRAVENOUS | Status: AC
  Filled 2018-03-22: qty 20

## 2018-03-22 MED ORDER — ACETAMINOPHEN 10 MG/ML IV SOLN
INTRAVENOUS | Status: AC
  Filled 2018-03-22: qty 100

## 2018-03-22 MED ORDER — MORPHINE SULFATE (PF) 4 MG/ML IV SOLN
INTRAVENOUS | Status: AC
  Filled 2018-03-22: qty 1

## 2018-03-22 MED ORDER — FENTANYL CITRATE (PF) 100 MCG/2ML IJ SOLN
INTRAMUSCULAR | Status: AC
  Filled 2018-03-22: qty 2

## 2018-03-22 MED ORDER — EPHEDRINE 5 MG/ML INJ
INTRAVENOUS | Status: AC
  Filled 2018-03-22: qty 10

## 2018-03-22 MED ORDER — FENTANYL CITRATE (PF) 250 MCG/5ML IJ SOLN
INTRAMUSCULAR | Status: AC
  Filled 2018-03-22: qty 5

## 2018-03-22 MED ORDER — PHENYLEPHRINE 40 MCG/ML (10ML) SYRINGE FOR IV PUSH (FOR BLOOD PRESSURE SUPPORT)
PREFILLED_SYRINGE | INTRAVENOUS | Status: DC | PRN
  Administered 2018-03-22: 40 ug via INTRAVENOUS

## 2018-03-22 MED ORDER — PROPOFOL 10 MG/ML IV BOLUS
INTRAVENOUS | Status: DC | PRN
  Administered 2018-03-22: 200 mg via INTRAVENOUS

## 2018-03-22 MED ORDER — FENTANYL CITRATE (PF) 100 MCG/2ML IJ SOLN
INTRAMUSCULAR | Status: DC | PRN
  Administered 2018-03-22 (×2): 50 ug via INTRAVENOUS

## 2018-03-22 MED ORDER — PROMETHAZINE HCL 25 MG/ML IJ SOLN: 6.2500 mg | INTRAMUSCULAR | Status: DC | PRN

## 2018-03-22 MED ORDER — ACETAMINOPHEN 10 MG/ML IV SOLN
INTRAVENOUS | Status: DC | PRN
  Administered 2018-03-22: 1000 mg via INTRAVENOUS

## 2018-03-22 MED ORDER — 0.9 % SODIUM CHLORIDE (POUR BTL) OPTIME
TOPICAL | Status: DC | PRN
  Administered 2018-03-22: 1000 mL

## 2018-03-22 MED ORDER — DEXAMETHASONE SODIUM PHOSPHATE 10 MG/ML IJ SOLN
INTRAMUSCULAR | Status: DC | PRN
  Administered 2018-03-22: 10 mg via INTRAVENOUS

## 2018-03-22 MED ORDER — SODIUM CHLORIDE 0.9 % IJ SOLN
INTRAMUSCULAR | Status: DC | PRN
  Administered 2018-03-22: 50 mL

## 2018-03-22 MED ORDER — SUCCINYLCHOLINE CHLORIDE 200 MG/10ML IV SOSY
PREFILLED_SYRINGE | INTRAVENOUS | Status: AC
  Filled 2018-03-22: qty 10

## 2018-03-22 MED ORDER — SODIUM CHLORIDE 0.9 % IR SOLN
Status: DC | PRN
  Administered 2018-03-22 (×2): 3000 mL

## 2018-03-22 MED ORDER — SUGAMMADEX SODIUM 200 MG/2ML IV SOLN
INTRAVENOUS | Status: DC | PRN
  Administered 2018-03-22: 200 mg via INTRAVENOUS

## 2018-03-22 MED ORDER — CHLORHEXIDINE GLUCONATE 4 % EX LIQD: 60.0000 mL | Freq: Once | CUTANEOUS | Status: DC

## 2018-03-22 MED ORDER — SODIUM CHLORIDE 0.9 % IV SOLN
INTRAVENOUS | Status: DC | PRN
  Administered 2018-03-22: 30 ug/min via INTRAVENOUS

## 2018-03-22 MED ORDER — LIDOCAINE HCL (CARDIAC) PF 100 MG/5ML IV SOSY
PREFILLED_SYRINGE | INTRAVENOUS | Status: DC | PRN
  Administered 2018-03-22: 60 mg via INTRAVENOUS

## 2018-03-22 MED ORDER — ROPIVACAINE HCL 7.5 MG/ML IJ SOLN
INTRAMUSCULAR | Status: DC | PRN
  Administered 2018-03-22: 20 mL via PERINEURAL

## 2018-03-22 MED ORDER — LACTATED RINGERS IV SOLN
INTRAVENOUS | Status: DC
  Administered 2018-03-22: 11:00:00 via INTRAVENOUS

## 2018-03-22 MED ORDER — MIDAZOLAM HCL 5 MG/5ML IJ SOLN
INTRAMUSCULAR | Status: DC | PRN
  Administered 2018-03-22: 2 mg via INTRAVENOUS

## 2018-03-22 MED ORDER — ONDANSETRON HCL 4 MG/2ML IJ SOLN
INTRAMUSCULAR | Status: DC | PRN
  Administered 2018-03-22: 4 mg via INTRAVENOUS

## 2018-03-22 MED ORDER — ROCURONIUM BROMIDE 10 MG/ML (PF) SYRINGE
PREFILLED_SYRINGE | INTRAVENOUS | Status: DC | PRN
  Administered 2018-03-22: 30 mg via INTRAVENOUS
  Administered 2018-03-22: 10 mg via INTRAVENOUS
  Administered 2018-03-22: 50 mg via INTRAVENOUS

## 2018-03-22 MED ORDER — HYDROCODONE-ACETAMINOPHEN 5-325 MG PO TABS
ORAL_TABLET | ORAL | Status: AC
  Filled 2018-03-22: qty 1

## 2018-03-22 SURGICAL SUPPLY — 59 items
BLADE CUTTER GATOR 3.5 (BLADE) IMPLANT
BLADE GREAT WHITE 4.2 (BLADE) ×2 IMPLANT
BLADE SURG 11 STRL SS (BLADE) ×2 IMPLANT
BUR OVAL 6.0 (BURR) IMPLANT
DRAPE INCISE IOBAN 66X45 STRL (DRAPES) ×4 IMPLANT
DRAPE STERI 35X30 U-POUCH (DRAPES) ×2 IMPLANT
DRAPE U-SHAPE 47X51 STRL (DRAPES) ×4 IMPLANT
DRSG TEGADERM 4X4.75 (GAUZE/BANDAGES/DRESSINGS) ×8 IMPLANT
DURAPREP 26ML APPLICATOR (WOUND CARE) ×4 IMPLANT
ELECT REM PT RETURN 9FT ADLT (ELECTROSURGICAL) ×2
ELECTRODE REM PT RTRN 9FT ADLT (ELECTROSURGICAL) ×1 IMPLANT
FILTER STRAW FLUID ASPIR (MISCELLANEOUS) ×2 IMPLANT
GAUZE SPONGE 4X4 12PLY STRL (GAUZE/BANDAGES/DRESSINGS) IMPLANT
GAUZE XEROFORM 1X8 LF (GAUZE/BANDAGES/DRESSINGS) ×2 IMPLANT
GLOVE BIOGEL PI IND STRL 8 (GLOVE) ×1 IMPLANT
GLOVE BIOGEL PI INDICATOR 8 (GLOVE) ×1
GLOVE ECLIPSE 7.0 STRL STRAW (GLOVE) ×2 IMPLANT
GLOVE INDICATOR 7.5 STRL GRN (GLOVE) ×2 IMPLANT
GLOVE SURG ORTHO 8.0 STRL STRW (GLOVE) ×2 IMPLANT
GOWN STRL REUS W/ TWL LRG LVL3 (GOWN DISPOSABLE) ×3 IMPLANT
GOWN STRL REUS W/TWL LRG LVL3 (GOWN DISPOSABLE) ×3
KIT BASIN OR (CUSTOM PROCEDURE TRAY) ×2 IMPLANT
KIT TURNOVER KIT B (KITS) ×2 IMPLANT
MANIFOLD NEPTUNE II (INSTRUMENTS) ×2 IMPLANT
NDL SUT 6 .5 CRC .975X.05 MAYO (NEEDLE) ×1 IMPLANT
NEEDLE HYPO 25X1 1.5 SAFETY (NEEDLE) ×2 IMPLANT
NEEDLE MAYO TAPER (NEEDLE) ×1
NEEDLE SPNL 18GX3.5 QUINCKE PK (NEEDLE) ×2 IMPLANT
NS IRRIG 1000ML POUR BTL (IV SOLUTION) ×2 IMPLANT
PACK SHOULDER (CUSTOM PROCEDURE TRAY) ×2 IMPLANT
PAD ARMBOARD 7.5X6 YLW CONV (MISCELLANEOUS) ×4 IMPLANT
PORT APPOLLO RF 90DEGREE MULTI (SURGICAL WAND) ×2 IMPLANT
SET ARTHROSCOPY TUBING (MISCELLANEOUS) ×1
SET ARTHROSCOPY TUBING LN (MISCELLANEOUS) ×1 IMPLANT
SLING ARM IMMOBILIZER MED (SOFTGOODS) ×2 IMPLANT
SPONGE LAP 4X18 RFD (DISPOSABLE) ×4 IMPLANT
STRIP CLOSURE SKIN 1/2X4 (GAUZE/BANDAGES/DRESSINGS) ×2 IMPLANT
SUCTION FRAZIER HANDLE 10FR (MISCELLANEOUS) ×1
SUCTION TUBE FRAZIER 10FR DISP (MISCELLANEOUS) ×1 IMPLANT
SUT ETHILON 3 0 PS 1 (SUTURE) ×2 IMPLANT
SUT FIBERWIRE 2-0 18 17.9 3/8 (SUTURE)
SUT MNCRL 3 0 RB1 (SUTURE) ×1 IMPLANT
SUT MONOCRYL 3 0 RB1 (SUTURE) ×1
SUT VIC AB 0 CT1 27 (SUTURE) ×1
SUT VIC AB 0 CT1 27XBRD ANBCTR (SUTURE) ×1 IMPLANT
SUT VIC AB 1 CT1 27 (SUTURE)
SUT VIC AB 1 CT1 27XBRD ANBCTR (SUTURE) IMPLANT
SUT VIC AB 2-0 CT1 27 (SUTURE) ×1
SUT VIC AB 2-0 CT1 TAPERPNT 27 (SUTURE) ×1 IMPLANT
SUT VICRYL 0 UR6 27IN ABS (SUTURE) ×2 IMPLANT
SUTURE FIBERWR 2-0 18 17.9 3/8 (SUTURE) IMPLANT
SYR 20CC LL (SYRINGE) ×2 IMPLANT
SYR 3ML LL SCALE MARK (SYRINGE) ×2 IMPLANT
SYR TB 1ML LUER SLIP (SYRINGE) ×2 IMPLANT
SYSTEM TENDODESIS IMPLANT (Miscellaneous) ×2 IMPLANT
TOWEL OR 17X24 6PK STRL BLUE (TOWEL DISPOSABLE) ×2 IMPLANT
TOWEL OR 17X26 10 PK STRL BLUE (TOWEL DISPOSABLE) ×2 IMPLANT
WAND HAND CNTRL MULTIVAC 90 (MISCELLANEOUS) IMPLANT
WATER STERILE IRR 1000ML POUR (IV SOLUTION) ×2 IMPLANT

## 2018-03-22 NOTE — Anesthesia Procedure Notes (Signed)
Procedure Name: Intubation Date/Time: 03/22/2018 1:08 PM Performed by: Carney Living, CRNA Pre-anesthesia Checklist: Patient identified, Emergency Drugs available, Suction available, Patient being monitored and Timeout performed Patient Re-evaluated:Patient Re-evaluated prior to induction Oxygen Delivery Method: Circle system utilized Preoxygenation: Pre-oxygenation with 100% oxygen Induction Type: IV induction Ventilation: Mask ventilation without difficulty Laryngoscope Size: Mac and 4 Grade View: Grade I Tube type: Oral Tube size: 7.5 mm Number of attempts: 1 Airway Equipment and Method: Stylet Placement Confirmation: ETT inserted through vocal cords under direct vision,  positive ETCO2 and breath sounds checked- equal and bilateral Secured at: 22 cm Tube secured with: Tape Dental Injury: Teeth and Oropharynx as per pre-operative assessment

## 2018-03-22 NOTE — Anesthesia Preprocedure Evaluation (Addendum)
Anesthesia Evaluation  Patient identified by MRN, date of birth, ID band Patient awake    Reviewed: Allergy & Precautions, NPO status , Patient's Chart, lab work & pertinent test results  History of Anesthesia Complications (+) PONV and history of anesthetic complications  Airway Mallampati: III  TM Distance: >3 FB Neck ROM: Full    Dental  (+) Dental Advisory Given, Teeth Intact   Pulmonary neg pulmonary ROS,    breath sounds clear to auscultation       Cardiovascular negative cardio ROS   Rhythm:Regular Rate:Normal     Neuro/Psych negative neurological ROS  negative psych ROS   GI/Hepatic negative GI ROS, Neg liver ROS,   Endo/Other  negative endocrine ROS  Renal/GU negative Renal ROS  negative genitourinary   Musculoskeletal negative musculoskeletal ROS (+)   Abdominal   Peds  Hematology negative hematology ROS (+)   Anesthesia Other Findings "Sensitive to narcotics"  Reproductive/Obstetrics                           Anesthesia Physical Anesthesia Plan  ASA: I  Anesthesia Plan: General   Post-op Pain Management:  Regional for Post-op pain   Induction: Intravenous  PONV Risk Score and Plan: 4 or greater and Treatment may vary due to age or medical condition, Ondansetron, Dexamethasone and Midazolam  Airway Management Planned: Oral ETT  Additional Equipment: None  Intra-op Plan:   Post-operative Plan: Extubation in OR  Informed Consent: I have reviewed the patients History and Physical, chart, labs and discussed the procedure including the risks, benefits and alternatives for the proposed anesthesia with the patient or authorized representative who has indicated his/her understanding and acceptance.   Dental advisory given  Plan Discussed with: CRNA, Anesthesiologist and Surgeon  Anesthesia Plan Comments: (Patient requests delaying nerve block until after speaking with  surgeon.)     Anesthesia Quick Evaluation

## 2018-03-22 NOTE — Anesthesia Procedure Notes (Signed)
Anesthesia Regional Block: Interscalene brachial plexus block   Pre-Anesthetic Checklist: ,, timeout performed, Correct Patient, Correct Site, Correct Laterality, Correct Procedure, Correct Position, site marked, Risks and benefits discussed,  Surgical consent,  Pre-op evaluation,  At surgeon's request and post-op pain management  Laterality: Right  Prep: chloraprep       Needles:  Injection technique: Single-shot  Needle Type: Echogenic Needle     Needle Length: 5cm  Needle Gauge: 21     Additional Needles:   Narrative:  Start time: 03/22/2018 12:47 PM End time: 03/22/2018 12:50 PM Injection made incrementally with aspirations every 5 mL.  Performed by: Personally  Anesthesiologist: Beryle Lathe, MD  Additional Notes: No pain on injection. No increased resistance to injection. Injection made in 5cc increments. Good needle visualization. Patient tolerated the procedure well.

## 2018-03-22 NOTE — Brief Op Note (Signed)
03/22/2018  3:00 PM  PATIENT:  Rick Dixon  22 y.o. male  PRE-OPERATIVE DIAGNOSIS:  right shoulder biceps tear  POST-OPERATIVE DIAGNOSIS:  right shoulder biceps tear  PROCEDURE:  Procedure(s): SHOULDER ARTHROSCOPY WITH LIMITED SUPERIOR LABRAL DEBRIDEMENT AND SUBPECTORAL BICEPS TENDODESIS  SURGEON:  Surgeon(s): Cammy Copa, MD  ASSISTANT: April Green rnfa  ANESTHESIA:   general  EBL: 10 ml    Total I/O In: 900 [I.V.:900] Out: 75 [Blood:75]  BLOOD ADMINISTERED: none  DRAINS: none   LOCAL MEDICATIONS USED:  none  SPECIMEN:  No Specimen  COUNTS:  YES  TOURNIQUET:  * No tourniquets in log *  DICTATION: .Other Dictation: Dictation Number [id number]  PLAN OF CARE: Discharge to home after PACU  PATIENT DISPOSITION:  PACU - hemodynamically stable

## 2018-03-22 NOTE — Anesthesia Postprocedure Evaluation (Signed)
Anesthesia Post Note  Patient: Rick Dixon  Procedure(s) Performed: SHOULDER ARTHROSCOPY WITH LIMITED SUPERIOR LABRAL DEBRIDEMENT AND SUBPECTORAL BICEPS TENDODESIS (Right Shoulder)     Patient location during evaluation: PACU Anesthesia Type: General Level of consciousness: awake and alert Pain management: pain level controlled Vital Signs Assessment: post-procedure vital signs reviewed and stable Respiratory status: spontaneous breathing, nonlabored ventilation and respiratory function stable Cardiovascular status: blood pressure returned to baseline and stable Postop Assessment: no apparent nausea or vomiting Anesthetic complications: no    Last Vitals:  Vitals:   03/22/18 1519 03/22/18 1526  BP:    Pulse: 74 77  Resp: 15 13  Temp:    SpO2: 100% 100%    Last Pain:  Vitals:   03/22/18 1526  TempSrc:   PainSc: 5                  Beryle Lathe

## 2018-03-22 NOTE — Transfer of Care (Signed)
Immediate Anesthesia Transfer of Care Note  Patient: Sosaia Beales  Procedure(s) Performed: SHOULDER ARTHROSCOPY WITH LIMITED SUPERIOR LABRAL DEBRIDEMENT AND SUBPECTORAL BICEPS TENDODESIS (Right Shoulder)  Patient Location: PACU  Anesthesia Type:GA combined with regional for post-op pain  Level of Consciousness: drowsy  Airway & Oxygen Therapy: Patient Spontanous Breathing and Patient connected to nasal cannula oxygen  Post-op Assessment: Report given to RN, Post -op Vital signs reviewed and stable and Patient moving all extremities  Post vital signs: Reviewed and stable  Last Vitals:  Vitals Value Taken Time  BP 111/67 03/22/2018  2:45 PM  Temp    Pulse 69 03/22/2018  2:45 PM  Resp 20 03/22/2018  2:45 PM  SpO2 100 % 03/22/2018  2:45 PM  Vitals shown include unvalidated device data.  Last Pain:  Vitals:   03/22/18 1005  TempSrc:   PainSc: 3       Patients Stated Pain Goal: 3 (03/22/18 1005)  Complications: No apparent anesthesia complications

## 2018-03-22 NOTE — H&P (Signed)
Rick Dixon is an 22 y.o. male.   Chief Complaint: Right shoulder pain HPI: Rick Dixon is a 22 year old patient with right shoulder pain.  He had shoulder pain for 2 years.  Had cortisone injection x1 which gave him some relief.  He was playing basketball and doing a lay up 3 weeks ago when he felt a pop in his shoulder and he developed a Popeye deformity.  He presents now for operative management.  He is very active.  He denies any shoulder instability.  Ultrasound examination demonstrates the bicep tendon to be out of the groove in the proximal humeral region.  It also demonstrates what appears to be the tendon just distal to the deltoid crossing fibers.  Past Medical History:  Diagnosis Date  . Biceps rupture, proximal    right  . Family history of adverse reaction to anesthesia    "My sister had a problem with a nerve block not sure what it was."  . PONV (postoperative nausea and vomiting)   . Seasonal allergies    claritin prn  . Wears contact lenses   . Wears glasses     Past Surgical History:  Procedure Laterality Date  . ANTERIOR CRUCIATE LIGAMENT REPAIR     related to wrestling. Dr. Durward Fortes    Family History  Problem Relation Age of Onset  . Healthy Mother   . Healthy Father        overweight  . Healthy Sister   . Healthy Brother        colonoscopy at age 53-6- benign polyp  . Colon cancer Paternal Grandmother        at age 47  . Healthy Sister   . Arthritis Maternal Grandfather        osteo.    Social History:  reports that he has never smoked. He has never used smokeless tobacco. He reports that he drinks about 8.0 - 10.0 standard drinks of alcohol per week. He reports that he does not use drugs.  Allergies: No Known Allergies  Medications Prior to Admission  Medication Sig Dispense Refill  . aspirin-acetaminophen-caffeine (EXCEDRIN MIGRAINE) 250-250-65 MG tablet Take 1 tablet by mouth every 6 (six) hours as needed for headache.    . ibuprofen (ADVIL,MOTRIN) 200  MG tablet Take 400 mg by mouth every 6 (six) hours as needed for headache or moderate pain.       Results for orders placed or performed during the hospital encounter of 03/22/18 (from the past 48 hour(s))  Comprehensive metabolic panel     Status: None   Collection Time: 03/22/18 10:25 AM  Result Value Ref Range   Sodium 141 135 - 145 mmol/L   Potassium 4.2 3.5 - 5.1 mmol/L   Chloride 108 98 - 111 mmol/L   CO2 24 22 - 32 mmol/L   Glucose, Bld 82 70 - 99 mg/dL   BUN 10 6 - 20 mg/dL   Creatinine, Ser 0.99 0.61 - 1.24 mg/dL   Calcium 9.7 8.9 - 10.3 mg/dL   Total Protein 6.8 6.5 - 8.1 g/dL   Albumin 4.2 3.5 - 5.0 g/dL   AST 31 15 - 41 U/L   ALT 37 0 - 44 U/L   Alkaline Phosphatase 46 38 - 126 U/L   Total Bilirubin 0.7 0.3 - 1.2 mg/dL   GFR calc non Af Amer >60 >60 mL/min   GFR calc Af Amer >60 >60 mL/min    Comment: (NOTE) The eGFR has been calculated using the CKD EPI equation.  This calculation has not been validated in all clinical situations. eGFR's persistently <60 mL/min signify possible Chronic Kidney Disease.    Anion gap 9 5 - 15    Comment: Performed at [facility] [address], [place] [address]  CBC     Status: None   Collection Time: 03/22/18 10:25 AM  Result Value Ref Range   WBC 4.7 4.0 - 10.5 K/uL   RBC 5.33 4.22 - 5.81 MIL/uL   Hemoglobin 15.8 13.0 - 17.0 g/dL   HCT 46.9 39.0 - 52.0 %   MCV 88.0 78.0 - 100.0 fL   MCH 29.6 26.0 - 34.0 pg   MCHC 33.7 30.0 - 36.0 g/dL   RDW 12.3 11.5 - 15.5 %   Platelets 164 150 - 400 K/uL    Comment: Performed at [facility], [place] [address] [place], [place] [address]   No results found.  Review of Systems  Musculoskeletal: Positive for joint pain.  All other systems reviewed and are negative.   Blood pressure 108/63, pulse 60, temperature 98.5 F (36.9 C), temperature source Oral, resp. rate 18, height _0  (1.727 m), weight 77.1 kg, SpO2 100 %. Physical Exam  Constitutional: He appears  well-developed.  HENT:  Head: Normocephalic.  Eyes: Pupils are equal, round, and reactive to light.  Neck: Normal range of motion.  Cardiovascular: Normal rate.  Respiratory: Effort normal.  Neurological: He is alert.  Skin: Skin is warm.  Psychiatric: He has a normal mood and affect.  Examination of the right shoulder demonstrates Popeye deformity in the right biceps.  Shoulder has good range of motion and good rotator cuff strength.  No restriction of passive range of motion.  Motor or sensory function to the hand is intact.  Assessment/Plan Impression is Popeye deformity in a young patient.  He is very active.  This is been going on for 3 weeks.  Plan is shoulder arthroscopy with likely debridement of the superior labrum and an attempt at performing biceps tenodesis in a subpectoral manner to restore some degree of tension to the biceps.  Risk and benefits are discussed including but not limited to infection nerve vessel damage inability to find the biceps tendon as well as incomplete pain relief and shoulder stiffness.  All questions answered  Anderson Malta, MD 03/22/2018, 12:06 PM

## 2018-03-22 NOTE — Op Note (Signed)
Rick Dixon, Rick Dixon MEDICAL RECORD [id number] ACCOUNT [id number] DATE OF BIRTH:Aug 03, 1995 FACILITY: [facility] LOCATION: [facility] PHYSICIAN:GREGORY Diamantina Providence, MD  OPERATIVE REPORT  DATE OF PROCEDURE:  03/22/2018  PREOPERATIVE DIAGNOSIS:  Right shoulder biceps tendon rupture with residual stump in the shoulder joint.  POSTOPERATIVE DIAGNOSIS:  Right shoulder biceps tendon rupture with residual stump in the shoulder joint.  PROCEDURE:  Right shoulder arthroscopy with limited debridement of the biceps tendon stump and superior labrum with open subpectoral biceps tenodesis using an Arthrex Endobutton.  SURGEON:  Cammy Copa, MD  ASSISTANT:  April Green, RNFA.  INDICATIONS:  The patient is a 22 year old patient with right shoulder pain.  He presents for operative management after explanation of risks and benefits.  OPERATIVE FINDINGS: 1.  Examination under anesthesia.  The patient had full forward flexion and abduction with external rotation at 15 degrees of abduction to about 60 degrees.  Shoulder stability was good anterior and posterior with less than a centimeter sulcus sign. 2.  Diagnostic arthroscopy: a.  A 2 cm biceps tendon stump within the joint with frayed end noted consistent with chronic degenerative tearing. b.  Intact rotator cuff. c.  Intact glenohumeral articular surfaces. d.  No injury to the anterior inferior or posterior inferior glenohumeral ligaments.  PROCEDURE IN DETAIL:  The patient was brought to the operating room where general anesthetic was induced.  Preoperative antibiotics administered.  Timeout was called.  The patient was placed in the beach chair position with the head in neutral position.   Right arm prescrubbed with alcohol and Betadine, allowed to air dry.  Prep with DuraPrep solution and draped in a sterile manner.  Ioban used to cover the axilla.  Posterior portal created 2 cm medial and inferior to the posterolateral margin of the  acromion.   Diagnostic arthroscopy was performed.  Anterior portal created under direct visualization.  Biceps tendon was torn and the stump was frayed and subluxating into and out of the joint.  The rotator cuff was intact and the glenohumeral ligaments  were intact.  At this time, anterior portal created under direct visualization and the stump was debrided using a combination of the shaver and Arthrocare wand.  Following this, shoulder joint thoroughly irrigated.  Instruments removed and portals closed  using 3-0 nylon.  At this time, [name] was then used to cover the entire operative field.  Incision was made just below the deltoid.  Skin and subcutaneous tissue was sharply divided.  The biceps tendon stump was present and located.  Sutures were placed  into the biceps tendon stump which was an Arthrex FiberLoop suture.  Secure fixation was achieved.  Then, with the arm extended the tendon was tacked back into a prepared bony bed using Arthrex Endobutton.  This gave a secure fixation.  Following this,  a thorough irrigation was performed of that incision and the incision was closed using 0 Vicryl suture, 2-0 Vicryl suture and a 3-0 Monocryl.  Impervious dressings were placed.  The patient tolerated the procedure well without immediate complications.   He was transferred to the recovery room in stable condition with a shoulder immobilizer in place.  TN/NUANCE  D:03/22/2018 T:03/22/2018 JOB:[id number]

## 2018-03-23 ENCOUNTER — Encounter (HOSPITAL_COMMUNITY): Payer: Self-pay | Admitting: Orthopedic Surgery

## 2018-03-27 ENCOUNTER — Other Ambulatory Visit: Payer: BLUE CROSS/BLUE SHIELD

## 2018-04-02 ENCOUNTER — Encounter (INDEPENDENT_AMBULATORY_CARE_PROVIDER_SITE_OTHER): Payer: Self-pay | Admitting: Orthopedic Surgery

## 2018-04-02 ENCOUNTER — Ambulatory Visit (INDEPENDENT_AMBULATORY_CARE_PROVIDER_SITE_OTHER): Payer: BLUE CROSS/BLUE SHIELD | Admitting: Orthopedic Surgery

## 2018-04-02 DIAGNOSIS — M66311 Spontaneous rupture of flexor tendons, right shoulder: Secondary | ICD-10-CM

## 2018-04-03 ENCOUNTER — Encounter (INDEPENDENT_AMBULATORY_CARE_PROVIDER_SITE_OTHER): Payer: Self-pay | Admitting: Orthopedic Surgery

## 2018-04-03 NOTE — Progress Notes (Signed)
   Post-Op Visit Note   Patient: Rick Dixon           Date of Birth: 12/30/1995           MRN: [id number] Visit Date: 04/02/2018 PCP: Shelva MajesticHunter, Stephen O, MD   Assessment & Plan:  Chief Complaint:  Chief Complaint  Patient presents with  . Right Shoulder - Routine Post Op   Visit Diagnoses:  1. Nontraumatic rupture of long head of biceps tendon of right shoulder     Plan: Rick Dixon presents now a week out right shoulder arthroscopy with labral debridement and subpectoral biceps tenodesis.  He is been doing well.  On exam incisions are intact.  Biceps contour looks pretty reasonable.  He came in today without his sling so I cautioned him to be very careful about doing too much for the next 5 weeks until that tendon really heals into the bone.  For now I think okay to be out of the sling and range of motion as tolerated but no weight lifting with that arm.  I will see him back for 5 weeks for final check.  Follow-Up Instructions: Return in about 5 weeks (around 05/07/2018).   Orders:  No orders of the defined types were placed in this encounter.  No orders of the defined types were placed in this encounter.   Imaging: No results found.  PMFS History: Patient Active Problem List   Diagnosis Date Noted  . Seasonal allergies   . Fracture of rib of right side with delayed healing 07/31/2013   Past Medical History:  Diagnosis Date  . Biceps rupture, proximal    right  . Family history of adverse reaction to anesthesia    "My sister had a problem with a nerve block not sure what it was."  . PONV (postoperative nausea and vomiting)   . Seasonal allergies    claritin prn  . Wears contact lenses   . Wears glasses     Family History  Problem Relation Age of Onset  . Healthy Mother   . Healthy Father        overweight  . Healthy Sister   . Healthy Brother        colonoscopy at age 465-6- benign polyp  . Colon cancer Paternal Grandmother        at age 22  . Healthy Sister     . Arthritis Maternal Grandfather        osteo.     Past Surgical History:  Procedure Laterality Date  . ANTERIOR CRUCIATE LIGAMENT REPAIR     related to wrestling. Dr. Cleophas DunkerWhitfield  . SHOULDER ARTHROSCOPY WITH DEBRIDEMENT AND BICEP TENDON REPAIR Right 03/22/2018   Procedure: SHOULDER ARTHROSCOPY WITH LIMITED SUPERIOR LABRAL DEBRIDEMENT AND SUBPECTORAL BICEPS TENDODESIS;  Surgeon: Cammy Copaean, Emmylou Bieker Scott, MD;  [facility];  Service: Orthopedics;  Laterality: Right;   Social History   Occupational History  . Not on file  Tobacco Use  . Smoking status: Never Smoker  . Smokeless tobacco: Never Used  Substance and Sexual Activity  . Alcohol use: Yes    Alcohol/week: 8.0 - 10.0 standard drinks    Types: 8 - 10 Standard drinks or equivalent per week    Comment: " a couple drinks a day"  . Drug use: No  . Sexual activity: Never

## 2018-08-06 ENCOUNTER — Encounter (INDEPENDENT_AMBULATORY_CARE_PROVIDER_SITE_OTHER): Payer: Self-pay | Admitting: Orthopedic Surgery

## 2018-08-06 ENCOUNTER — Ambulatory Visit (INDEPENDENT_AMBULATORY_CARE_PROVIDER_SITE_OTHER): Payer: BLUE CROSS/BLUE SHIELD | Admitting: Orthopedic Surgery

## 2018-08-06 VITALS — Ht 68.0 in | Wt 170.0 lb

## 2018-08-06 DIAGNOSIS — M66311 Spontaneous rupture of flexor tendons, right shoulder: Secondary | ICD-10-CM | POA: Diagnosis not present

## 2018-08-06 NOTE — Progress Notes (Signed)
Office Visit Note   Patient: Rick Dixon           Date of Birth: [birth year]           MRN: [id number] Visit Date: 08/06/2018 Requested by: Shelva Majestic, MD [address] [place], [place] [address] PCP: Shelva Majestic, MD  Subjective: Chief Complaint  Patient presents with  . Right Shoulder - Follow-up    03/22/2018 Right Shoulder Scope with Superior Labral Debridement and Subpectoral Biceps Tenodesis    HPI: Rick Dixon is a patient is now over 4 months out right shoulder arthroscopy with biceps release and sub-pec tenodesis.  He is been doing well.  He only followed up one time after surgery.  Question today is whether or not he needs any physical therapy.  Is not taking anything for pain.  Wants to do basketball running and weightlifting.              ROS: All systems reviewed are negative as they relate to the chief complaint within the history of present illness.  Patient denies  fevers or chills.   Assessment & Plan: Visit Diagnoses:  1. Nontraumatic rupture of long head of biceps tendon of right shoulder     Plan: Impression is well-functioning right shoulder with good rotator cuff strength good range of motion in symmetric appearing biceps contours.  Plan is that patient does not need therapy.  I think he has to have a gradual return to normal functional activity.  He may not be able to do everything that he used to do but I think the shoulder has great strength and range of motion and does not need therapy at this time.  I will see him back as needed  Follow-Up Instructions: Return if symptoms worsen or fail to improve.   Orders:  No orders of the defined types were placed in this encounter.  No orders of the defined types were placed in this encounter.     Procedures: No procedures performed   Clinical Data: No additional findings.  Objective: Vital Signs: Ht 5\' 8"  (1.727 m)   Wt 170 lb (77.1 kg)   BMI 25.85 kg/m   Physical Exam:   Constitutional:  Patient appears well-developed HEENT:  Head: Normocephalic Eyes:EOM are normal Neck: Normal range of motion Cardiovascular: Normal rate Pulmonary/chest: Effort normal Neurologic: Patient is alert Skin: Skin is warm Psychiatric: Patient has normal mood and affect    Ortho Exam: Ortho exam demonstrates full active and passive range of motion of that right shoulder.  Biceps contour is normal.  Has good supination strength bilaterally.  Not much in the way of coarse grinding or crepitus with passive or active shoulder range of motion.  Specialty Comments:  No specialty comments available.  Imaging: No results found.   PMFS History: Patient Active Problem List   Diagnosis Date Noted  . Seasonal allergies   . Fracture of rib of right side with delayed healing 07/31/2013   Past Medical History:  Diagnosis Date  . Biceps rupture, proximal    right  . Family history of adverse reaction to anesthesia    "My sister had a problem with a nerve block not sure what it was."  . PONV (postoperative nausea and vomiting)   . Seasonal allergies    claritin prn  . Wears contact lenses   . Wears glasses     Family History  Problem Relation Age of Onset  . Healthy Mother   . Healthy Father  overweight  . Healthy Sister   . Healthy Brother        colonoscopy at age 525-6- benign polyp  . Colon cancer Paternal Grandmother        at age 23  . Healthy Sister   . Arthritis Maternal Grandfather        osteo.     Past Surgical History:  Procedure Laterality Date  . ANTERIOR CRUCIATE LIGAMENT REPAIR     related to wrestling. Dr. Cleophas DunkerWhitfield  . SHOULDER ARTHROSCOPY WITH DEBRIDEMENT AND BICEP TENDON REPAIR Right 03/22/2018   Procedure: SHOULDER ARTHROSCOPY WITH LIMITED SUPERIOR LABRAL DEBRIDEMENT AND SUBPECTORAL BICEPS TENDODESIS;  Surgeon: Cammy Copaean, Manvi Guilliams Scott, MD;  [facility];  Service: Orthopedics;  Laterality: Right;   Social History   Occupational History  . Not on file    Tobacco Use  . Smoking status: Never Smoker  . Smokeless tobacco: Never Used  Substance and Sexual Activity  . Alcohol use: Yes    Alcohol/week: 8.0 - 10.0 standard drinks    Types: 8 - 10 Standard drinks or equivalent per week    Comment: " a couple drinks a day"  . Drug use: No  . Sexual activity: Never

## 2018-08-10 ENCOUNTER — Ambulatory Visit: Payer: BLUE CROSS/BLUE SHIELD | Admitting: Family Medicine

## 2018-08-13 ENCOUNTER — Encounter: Payer: Self-pay | Admitting: Physician Assistant

## 2018-08-13 ENCOUNTER — Ambulatory Visit (INDEPENDENT_AMBULATORY_CARE_PROVIDER_SITE_OTHER): Payer: BLUE CROSS/BLUE SHIELD | Admitting: Physician Assistant

## 2018-08-13 VITALS — BP 118/76 | HR 66 | Temp 98.4°F | Ht 68.0 in | Wt 175.2 lb

## 2018-08-13 DIAGNOSIS — R5383 Other fatigue: Secondary | ICD-10-CM | POA: Diagnosis not present

## 2018-08-13 DIAGNOSIS — Z720 Tobacco use: Secondary | ICD-10-CM | POA: Diagnosis not present

## 2018-08-13 LAB — CBC WITH DIFFERENTIAL/PLATELET
BASOS ABS: 0 10*3/uL (ref 0.0–0.1)
Basophils Relative: 0.7 % (ref 0.0–3.0)
EOS ABS: 0.1 10*3/uL (ref 0.0–0.7)
EOS PCT: 1.4 % (ref 0.0–5.0)
HCT: 44.6 % (ref 39.0–52.0)
Hemoglobin: 15.6 g/dL (ref 13.0–17.0)
LYMPHS ABS: 1.9 10*3/uL (ref 0.7–4.0)
Lymphocytes Relative: 45.8 % (ref 12.0–46.0)
MCHC: 35 g/dL (ref 30.0–36.0)
MCV: 85.9 fl (ref 78.0–100.0)
MONOS PCT: 11.5 % (ref 3.0–12.0)
Monocytes Absolute: 0.5 10*3/uL (ref 0.1–1.0)
NEUTROS ABS: 1.7 10*3/uL (ref 1.4–7.7)
NEUTROS PCT: 40.6 % — AB (ref 43.0–77.0)
PLATELETS: 171 10*3/uL (ref 150.0–400.0)
RBC: 5.2 Mil/uL (ref 4.22–5.81)
RDW: 13.5 % (ref 11.5–15.5)
WBC: 4.2 10*3/uL (ref 4.0–10.5)

## 2018-08-13 LAB — COMPREHENSIVE METABOLIC PANEL
ALT: 33 U/L (ref 0–53)
AST: 26 U/L (ref 0–37)
Albumin: 4.8 g/dL (ref 3.5–5.2)
Alkaline Phosphatase: 53 U/L (ref 39–117)
BUN: 11 mg/dL (ref 6–23)
CHLORIDE: 100 meq/L (ref 96–112)
CO2: 28 mEq/L (ref 19–32)
CREATININE: 0.97 mg/dL (ref 0.40–1.50)
Calcium: 10.1 mg/dL (ref 8.4–10.5)
GFR: 96.65 mL/min (ref >60.00)
GLUCOSE: 78 mg/dL (ref 70–99)
Potassium: 3.5 mEq/L (ref 3.5–5.1)
SODIUM: 137 meq/L (ref 135–145)
Total Bilirubin: 1 mg/dL (ref 0.2–1.2)
Total Protein: 7.2 g/dL (ref 6.0–8.3)

## 2018-08-13 LAB — TSH: TSH: 2.08 u[IU]/mL (ref 0.35–4.50)

## 2018-08-13 MED ORDER — NICOTINE 21 MG/24HR TD PT24: 21.0000 mg | MEDICATED_PATCH | Freq: Every day | TRANSDERMAL | 0 refills | Status: DC

## 2018-08-13 MED ORDER — NICOTINE 21 MG/24HR TD PT24: 21.0000 mg | MEDICATED_PATCH | Freq: Every day | TRANSDERMAL | 2 refills | Status: DC

## 2018-08-13 NOTE — Patient Instructions (Signed)
It was great to see you!   We will call you with your lab results.   

## 2018-08-13 NOTE — Progress Notes (Signed)
Lavina HammanJacob Webb is a 23 y.o. male here for a new problem.  History of Present Illness:   Chief Complaint  Patient presents with  . Fatigue     x 2 weeks    HPI   Has had increased exhaustion x 2 weeks. Has a history of mono a few years ago. His girlfriend recently had mono. He went [facility] [employer] [employer] and states that he was tested for POC mono and then also a blood test for only "active mono." Both were negative.  He has been getting adequate sleep. Has had decreased appetite. No unintentional weight loss. Endorses anxiety, but not uncontrolled. Having some night sweats. Appetite has been decreased. Minor sore throat. No flu shot this year. Denies fever, abdominal pain, bloody stool.  Wt Readings from Last 5 Encounters:  08/13/18 175 lb 3.2 oz (79.5 kg)  08/06/18 170 lb (77.1 kg)  03/22/18 170 lb (77.1 kg)  03/12/18 170 lb (77.1 kg)  02/08/18 170 lb (77.1 kg)   Vaping for the past 3 years. Would like to quit.  Past Medical History:  Diagnosis Date  . Biceps rupture, proximal    right  . Family history of adverse reaction to anesthesia    "My sister had a problem with a nerve block not sure what it was."  . PONV (postoperative nausea and vomiting)   . Seasonal allergies    claritin prn  . Wears contact lenses   . Wears glasses      Social History   Socioeconomic History  . Marital status: Single    Spouse name: Not on file  . Number of children: Not on file  . Years of education: Not on file  . Highest education level: Not on file  Occupational History  . Not on file  Social Needs  . Financial resource strain: Not on file  . Food insecurity:    Worry: Not on file    Inability: Not on file  . Transportation needs:    Medical: Not on file    Non-medical: Not on file  Tobacco Use  . Smoking status: Never Smoker  . Smokeless tobacco: Never Used  Substance and Sexual Activity  . Alcohol use: Yes    Alcohol/week: 8.0 - 10.0  standard drinks    Types: 8 - 10 Standard drinks or equivalent per week    Comment: " a couple drinks a day"  . Drug use: No  . Sexual activity: Never  Lifestyle  . Physical activity:    Days per week: Not on file    Minutes per session: Not on file  . Stress: Not on file  Relationships  . Social connections:    Talks on phone: Not on file    Gets together: Not on file    Attends religious service: Not on file    Active member of club or organization: Not on file    Attends meetings of clubs or organizations: Not on file    Relationship status: Not on file  . Intimate partner violence:    Fear of current or ex partner: Not on file    Emotionally abused: Not on file    Physically abused: Not on file    Forced sexual activity: Not on file  Other Topics Concern  . Not on file  Social History Narrative   Single. Lives at home in summer and in dorms during the year      Graduates 2020 from [employer]. 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 ANTERIOR CRUCIATE LIGAMENT REPAIR     related to wrestling. Dr. Cleophas Dunker  . SHOULDER ARTHROSCOPY WITH DEBRIDEMENT AND BICEP TENDON REPAIR Right 03/22/2018   Procedure: SHOULDER ARTHROSCOPY WITH LIMITED SUPERIOR LABRAL DEBRIDEMENT AND SUBPECTORAL BICEPS TENDODESIS;  Surgeon: Cammy Copa, MD;  Location: [facility] OR;  Service: Orthopedics;  Laterality: Right;    Family History  Problem Relation Age of Onset  . Healthy Mother   . Healthy Father        overweight  . Healthy Sister   . Healthy Brother        colonoscopy at age 63-6- benign polyp  . Colon cancer Paternal Grandmother        at age 1  . Healthy Sister   . Arthritis Maternal Grandfather        osteo.     No Known Allergies  Current Medications:   Current Outpatient Medications:  .  nicotine  (NICODERM CQ - DOSED IN MG/24 HOURS) 21 mg/24hr patch, Place 1 patch (21 mg total) onto the skin daily., Disp: 7 patch, Rfl: 2   Review of Systems:   ROS Negative unless otherwise specified per HPI.  Vitals:   Vitals:   08/13/18 0917  BP: 118/76  Pulse: 66  Temp: 98.4 F (36.9 C)  TempSrc: Oral  SpO2: 99%  Weight: 175 lb 3.2 oz (79.5 kg)  Height: 5\' 8"  (1.727 m)     Body mass index is 26.64 kg/m.  Physical Exam:   Physical Exam Vitals signs and nursing note reviewed.  Constitutional:      General: He is not in acute distress.    Appearance: He is well-developed. He is not ill-appearing or toxic-appearing.  HENT:     Head: Normocephalic.     Mouth/Throat:     Lips: Pink.     Mouth: Mucous membranes are moist.     Pharynx: Posterior oropharyngeal erythema present.     Tonsils: Swelling: 2+ on the right. 2+ on the left.  Neck:     Musculoskeletal: Full passive range of motion without pain and normal range of motion.  Cardiovascular:     Rate and Rhythm: Normal rate and regular rhythm.     Pulses: Normal pulses.     Heart sounds: Normal heart sounds, S1 normal and S2 normal.     Comments: No LE edema Pulmonary:     Effort: Pulmonary effort is normal.     Breath sounds: Normal breath sounds.  Skin:    General: Skin is warm and dry.  Neurological:     Mental Status: He is alert.     GCS: GCS eye subscore is 4. GCS verbal subscore is 5. GCS motor subscore is 6.  Psychiatric:        Speech: Speech normal.        Behavior: Behavior normal. Behavior is cooperative.      Assessment and Plan:   Tedd was seen today for fatigue.  Diagnoses and all orders for this visit:  Fatigue, unspecified type No red flags on exam. Will check Epstein-Barr panel. Will also check labs. Discussed adequate nutrition and hydration. Follow-up with PCP if symptoms persist. -     CBC with Differential/Platelet -     Comprehensive metabolic panel -  TSH -     Epstein-Barr virus  VCA antibody panel  Tobacco abuse He has anxiety and I am reluctant to start Chantix, will defer to PCP. Will trial patches. Patient verbalized understanding. Agreeable to plan.  Other orders -     Discontinue: nicotine (NICODERM CQ - DOSED IN MG/24 HOURS) 21 mg/24hr patch; Place 1 patch (21 mg total) onto the skin daily. -     Discontinue: nicotine (NICODERM CQ - DOSED IN MG/24 HOURS) 21 mg/24hr patch; Place 1 patch (21 mg total) onto the skin daily. -     nicotine (NICODERM CQ - DOSED IN MG/24 HOURS) 21 mg/24hr patch; Place 1 patch (21 mg total) onto the skin daily.    . Reviewed expectations re: course of current medical issues. . Discussed self-management of symptoms. . Outlined signs and symptoms indicating need for more acute intervention. . Patient verbalized understanding and all questions were answered. . See orders for this visit as documented in the electronic medical record. . Patient received an After-Visit Summary.  CMA or LPN served as scribe during this visit. History, Physical, and Plan performed by medical provider. The above documentation has been reviewed and is accurate and complete.  Jarold MottoSamantha Breslin Burklow, PA-C

## 2018-08-14 LAB — EPSTEIN-BARR VIRUS VCA ANTIBODY PANEL
EBV NA IgG: 312 U/mL — ABNORMAL HIGH
EBV VCA IgG: 49 U/mL — ABNORMAL HIGH
EBV VCA IgM: <36 U/mL

## 2018-10-01 ENCOUNTER — Telehealth: Payer: Self-pay | Admitting: Family Medicine

## 2018-10-01 NOTE — Telephone Encounter (Signed)
I do not see that the patient is scheduled-this would need to be a late afternoon visit with car testing versus drive up testing with home health.  What is the status of patient being seen?

## 2018-10-01 NOTE — Telephone Encounter (Signed)
FYI  Spoke to pt and he sound upset when I called. Pt told me that the would call me back later. I stopped pt before he hung up and advised him of his appt. Pt stated that he was okay and did not need to be seen before hanging up.

## 2018-10-01 NOTE — Telephone Encounter (Signed)
[facility] Night - [place] TELEPHONE ADVICE RECORD [facility] Call Center Patient Name: ZEKIAH GRIESSER Gender: Male DOB: 02-16-1996  Age: 23 Y 3 M 11 D Return Phone Number: [phone] (Primary), [phone] (Secondary) Address:  [place]  [address] Client [facility] Night Physician Tana Conch- MD Contact Type Call Who Is Calling Patient / Member / Family / Caregiver Call Type Triage / Clinical [name] Name Tammie Fearing Relationship To Patient Mother Return Phone Number [phone] (Primary) Chief Complaint Cough Reason for Call Symptomatic / Request for Health Information Initial Comment [name] states her son returned home from spring break. [name] states he has a cough, fever, and minor aches and pains. GOTO Facility Not Listed telemed Translation No Nurse Assessment Nurse: Clarisa Schools, RN, Leah Date/Time (Eastern Time): 09/29/2018 1:22:42 PM Confirm and document reason for call. If symptomatic, describe symptoms. ---[name] states her son returned home from spring break. [name] states he has a cough, fever, and minor aches and pains. T 100.5 (PO), fever started last night. He did have Flu shot. Has the patient traveled to [place], [place], [place], [place], or [place] OR had close contact with a person known to have the novel coronavirus illness in the last 14 days? ---No Does the patient have any new or worsening symptoms? ---Yes Will a triage be completed? ---Yes Related visit to physician within the last 2 weeks? ---No Does the PT have any chronic conditions? (i.e. diabetes, asthma, this includes High risk factors for pregnancy, etc.) ---No Is this a behavioral health or substance abuse call? ---No Guidelines Guideline Title Affirmed Question Affirmed Notes Nurse Date/Time Lamount Cohen Time) Influenza - Seasonal Earache Leming, RN, Leah  09/29/2018 1:24:03 PM Disp. Time Lamount Cohen Time) Disposition Final User 09/29/2018 1:28:14 PM See PCP within 24 Hours Yes Leming, RN, Leah   PLEASE NOTE:  All timestamps contained within this report are represented as [place] Standard Time. CONFIDENTIALTY NOTICE: This fax transmission is intended only for the addressee.  It contains information that is legally privileged, confidential or otherwise protected from use or disclosure.  If you are not the intended recipient, you are strictly prohibited from reviewing, disclosing, copying using or disseminating any of this information or taking any action in reliance on or regarding this information.  If you have received this fax in error, please notify us immediately by telephone so that we can arrange for its return to [place]. Phone:  [phone], Toll-Free:  [phone], Fax:  [phone] Page: 2 of 2 Call Id: [id number]   [name] Disagree/Comply Comply [name] Understands Yes PreDisposition Call Doctor Care Advice Given Per Guideline SEE PCP WITHIN 24 HOURS: * IF OFFICE WILL BE CLOSED AND NO PCP (PRIMARY CARE PROVIDER) SECONDLEVEL TRIAGE: You need to be seen within the next 24 hours. A clinic or an urgent care center is often a good source of care if your doctor's office is closed or you can't get an appointment. FOR A STUFFY NOSE - USE NASAL WASHES: * Introduction: Saline (salt water) nasal irrigation (nasal wash) is an effective and simple home remedy for treating stuffy nose and sinus congestion. The nose can be irrigated by pouring, spraying, or squirting salt water into the nose and then letting it run back out. * How it Helps: The salt water rinses out excess mucus, washes out any irritants (dust, allergens) that might be present, and moistens the nasal cavity. CALL BACK IF: * Fever over  104 F (40 C) * Difficulty breathing occurs * You become worse. CARE ADVICE given per INFLUENZA - SEASONAL (Adult) guideline. After Care Instructions Given Call Event  Type User Date / Time Description Education document email Flora [place], RN, Tacey Ruiz 09/29/2018 1:27:18 PM [facility] Connect Now

## 2018-10-03 NOTE — Telephone Encounter (Signed)
Per previous note, pt did not want to come in so appt was not scheduled. Appt was going to be made with Dr. Caryl Never.

## 2019-01-06 DIAGNOSIS — Z20828 Contact with and (suspected) exposure to other viral communicable diseases: Secondary | ICD-10-CM | POA: Diagnosis not present

## 2019-01-23 NOTE — Progress Notes (Signed)
Phone: [phone]    Subjective:  Patient presents today for their annual physical. Chief complaint-noted.   See problem oriented charting- ROS- full  review of systems was completed and negative including No chest pain or shortness of breath. No headache or blurry vision.  No fever/chills.   The following were reviewed and entered/updated in epic: Past Medical History:  Diagnosis Date  . Biceps rupture, proximal    right  . Family history of adverse reaction to anesthesia    "My sister had a problem with a nerve block not sure what it was."  . PONV (postoperative nausea and vomiting)   . Seasonal allergies    claritin prn  . Wears contact lenses   . Wears glasses    Patient Active Problem List   Diagnosis Date Noted  . Seasonal allergies   . Fracture of rib of right side with delayed healing 07/31/2013   Past Surgical History:  Procedure Laterality Date  . ANTERIOR CRUCIATE LIGAMENT REPAIR     related to wrestling. Dr. Durward Fortes  . SHOULDER ARTHROSCOPY WITH DEBRIDEMENT AND BICEP TENDON REPAIR Right 03/22/2018   Procedure: SHOULDER ARTHROSCOPY WITH LIMITED SUPERIOR LABRAL DEBRIDEMENT AND SUBPECTORAL BICEPS TENDODESIS;  Surgeon: Meredith Pel, MD;  Location: [facility];  Service: Orthopedics;  Laterality: Right;    Family History  Problem Relation Age of Onset  . Healthy Mother   . Healthy Father        overweight  . Healthy Sister   . Healthy Brother        colonoscopy at age 68-6- benign polyp  . Colon cancer Paternal Grandmother        at age 44  . Healthy Sister   . Arthritis Maternal Grandfather        osteo.     Medications- reviewed and updated No current outpatient medications on file.   No current facility-administered medications for this visit.     Allergies-reviewed and updated No Known Allergies  Social History   Social History Narrative   Single. Lives at home in summer and in [place] during the year      Graduates 2020 from [employer] major. 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 Oropharynx normal Neck: no thyromegaly CV: RRR no murmurs rubs or gallops Lungs: CTAB no crackles, wheeze, rhonchi Abdomen: soft/nontender/nondistended/normal bowel sounds. No rebound or guarding.  Ext: no edema Skin: warm, dry Neuro: grossly normal, moves all extremities, PERRLA     Assessment and Plan:  23 y.o. male presenting for annual physical.  Health Maintenance counseling: 1. Anticipatory guidance: Patient counseled regarding regular dental exams -q6 months, eye exams - yearly,  avoiding smoking and second hand smoke , limiting alcohol to 2 beverages per day.   2. Risk factor reduction:  Advised patient of need for regular exercise and diet rich and fruits and vegetables to reduce risk of heart attack and stroke. Exercise- 5-6 days a week. Diet-eating reasonably clean diet.  Wt Readings from Last 3 Encounters:  01/24/19 172 lb (78 kg)  08/13/18 175 lb  3.2 oz (79.5 kg)  08/06/18 170 lb (77.1 kg)  3. Immunizations/screenings/ancillary studies Immunization History  Administered Date(s) Administered  . DTaP 08/22/1996, 10/09/1996, 01/02/1997, 10/07/1997, 11/27/2001  . Hepatitis A 08/31/2006, 03/06/2008  . Hepatitis B 07/22/1996, 01/02/1997, 03/12/1997  . HiB (PRP-OMP) 08/22/1996, 10/09/1996, 01/02/1997, 10/07/1997  . IPV 08/22/1996, 10/09/1996, 07/04/1997, 11/27/2001  . Influenza Nasal 07/30/2006, 06/04/2008, 05/04/2010  . MMR 07/04/1997, 11/27/2001  . Meningococcal B, OMV 12/07/2016  . Meningococcal Conjugate 05/04/2010  . Meningococcal Mcv4o 12/07/2016  . Td 03/06/2008  . Tdap 03/06/2008,  01/24/2019  . Varicella 10/07/1997, 05/05/2010  4. Prostate cancer screening- no family history, start at age 50  5. Colon cancer screening - paternal grandmother at age 73- recommend he start screening age 32 6. Skin cancer screening/prevention- no dermatologist. advised regular sunscreen use. Denies worrisome, changing, or new skin lesions.  7. Testicular cancer screening- advised monthly self exams  8. STD screening- patient opts in. Only has 1 partner- she is on IUD. Has unprotected sex- we will screen today 9. 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 Preventative health care  Z00.00 Lipid panel    HIV Antibody (routine testing w rflx)    RPR    Urine cytology ancillary only  2. Screening for hyperlipidemia  Z13.220 Lipid panel  3. Screening for HIV (human immunodeficiency virus)  Z11.4 HIV Antibody (routine testing w rflx)  4. Screening examination for venereal disease  Z11.3 RPR  5. Screening for gonorrhea  Z11.3 Urine cytology ancillary only  6. Screening for chlamydial disease  Z11.8 Urine cytology ancillary only  7. Need for Tdap vaccination  Z23 Tdap vaccine greater than or equal to 7yo IM   Return precautions advised.  Garret Reddish, MD

## 2019-01-24 ENCOUNTER — Other Ambulatory Visit (HOSPITAL_COMMUNITY)
Admission: RE | Admit: 2019-01-24 | Discharge: 2019-01-24 | Disposition: A | Discharge status: Home / Self Care | Payer: BLUE CROSS/BLUE SHIELD | Source: Ambulatory Visit | Attending: Family Medicine | Admitting: Family Medicine

## 2019-01-24 ENCOUNTER — Ambulatory Visit (INDEPENDENT_AMBULATORY_CARE_PROVIDER_SITE_OTHER): Payer: BLUE CROSS/BLUE SHIELD | Admitting: Family Medicine

## 2019-01-24 ENCOUNTER — Encounter: Payer: Self-pay | Admitting: Family Medicine

## 2019-01-24 ENCOUNTER — Other Ambulatory Visit: Payer: Self-pay

## 2019-01-24 VITALS — BP 120/88 | HR 81 | Temp 98.9°F | Ht 68.0 in | Wt 172.0 lb

## 2019-01-24 DIAGNOSIS — Z23 Encounter for immunization: Secondary | ICD-10-CM

## 2019-01-24 DIAGNOSIS — Z13220 Encounter for screening for lipoid disorders: Secondary | ICD-10-CM

## 2019-01-24 DIAGNOSIS — Z118 Encounter for screening for other infectious and parasitic diseases: Secondary | ICD-10-CM | POA: Insufficient documentation

## 2019-01-24 DIAGNOSIS — Z113 Encounter for screening for infections with a predominantly sexual mode of transmission: Secondary | ICD-10-CM | POA: Diagnosis not present

## 2019-01-24 DIAGNOSIS — Z0000 Encounter for general adult medical examination without abnormal findings: Secondary | ICD-10-CM | POA: Diagnosis not present

## 2019-01-24 DIAGNOSIS — Z114 Encounter for screening for human immunodeficiency virus [HIV]: Secondary | ICD-10-CM

## 2019-01-24 LAB — LIPID PANEL
Cholesterol: 165 mg/dL (ref 0–200)
HDL: 49 mg/dL (ref >39.00)
LDL Cholesterol: 99 mg/dL (ref 0–99)
NonHDL: 116.48
Total CHOL/HDL Ratio: 3
Triglycerides: 85 mg/dL (ref 0.0–149.0)
VLDL: 17 mg/dL (ref 0.0–40.0)

## 2019-01-24 NOTE — Patient Instructions (Addendum)
Health Maintenance Due  Topic Date Due  . TETANUS/TDAP Done today 03/06/2018    Please stop by lab before you go If you do not have mychart- we will call you about results within 5 business days of [place] receiving them.  If you have mychart- we will send your results within 3 business days of [place] receiving them.  If abnormal or we want to clarify a result, we will call or mychart you to make sure you receive the message.  If you have questions or concerns or don't hear within 5-7 days, please send [place] a message or call us.   Congrats on your plan with  teach for [place] !!!

## 2019-01-24 NOTE — Addendum Note (Signed)
Addended by: Francis Dowse T on: 01/24/2019 10:24 AM   Modules accepted: Orders

## 2019-01-25 LAB — URINE CYTOLOGY ANCILLARY ONLY
Chlamydia: NEGATIVE
Neisseria Gonorrhea: NEGATIVE
Trichomonas: NEGATIVE

## 2019-01-25 LAB — HIV ANTIBODY (ROUTINE TESTING W REFLEX): HIV 1&2 Ab, 4th Generation: NONREACTIVE

## 2019-01-25 LAB — RPR: RPR Ser Ql: NONREACTIVE

## 2019-03-02 DIAGNOSIS — S61152A Open bite of left thumb with damage to nail, initial encounter: Secondary | ICD-10-CM | POA: Diagnosis not present

## 2019-03-02 DIAGNOSIS — M79642 Pain in left hand: Secondary | ICD-10-CM | POA: Diagnosis not present

## 2019-03-02 DIAGNOSIS — S61002A Unspecified open wound of left thumb without damage to nail, initial encounter: Secondary | ICD-10-CM | POA: Diagnosis not present

## 2019-03-12 DIAGNOSIS — H52223 Regular astigmatism, bilateral: Secondary | ICD-10-CM | POA: Diagnosis not present

## 2019-05-07 DIAGNOSIS — R05 Cough: Secondary | ICD-10-CM | POA: Diagnosis not present

## 2019-05-07 DIAGNOSIS — R0981 Nasal congestion: Secondary | ICD-10-CM | POA: Diagnosis not present

## 2019-05-07 DIAGNOSIS — J029 Acute pharyngitis, unspecified: Secondary | ICD-10-CM | POA: Diagnosis not present

## 2019-05-07 DIAGNOSIS — M791 Myalgia, unspecified site: Secondary | ICD-10-CM | POA: Diagnosis not present

## 2019-06-06 DIAGNOSIS — Z20828 Contact with and (suspected) exposure to other viral communicable diseases: Secondary | ICD-10-CM | POA: Diagnosis not present

## 2019-06-26 DIAGNOSIS — Z20828 Contact with and (suspected) exposure to other viral communicable diseases: Secondary | ICD-10-CM | POA: Diagnosis not present

## 2019-07-01 DIAGNOSIS — Z20828 Contact with and (suspected) exposure to other viral communicable diseases: Secondary | ICD-10-CM | POA: Diagnosis not present

## 2019-07-04 DIAGNOSIS — L0390 Cellulitis, unspecified: Secondary | ICD-10-CM | POA: Diagnosis not present

## 2019-07-09 ENCOUNTER — Other Ambulatory Visit: Payer: Self-pay

## 2019-07-10 ENCOUNTER — Encounter: Payer: Self-pay | Admitting: Family Medicine

## 2019-07-10 ENCOUNTER — Ambulatory Visit (INDEPENDENT_AMBULATORY_CARE_PROVIDER_SITE_OTHER): Payer: BLUE CROSS/BLUE SHIELD | Admitting: Family Medicine

## 2019-07-10 VITALS — BP 110/72 | HR 94 | Temp 98.5°F | Ht 68.0 in | Wt 177.0 lb

## 2019-07-10 DIAGNOSIS — Z20822 Contact with and (suspected) exposure to covid-19: Secondary | ICD-10-CM

## 2019-07-10 DIAGNOSIS — L02512 Cutaneous abscess of left hand: Secondary | ICD-10-CM | POA: Diagnosis not present

## 2019-07-10 DIAGNOSIS — Z20828 Contact with and (suspected) exposure to other viral communicable diseases: Secondary | ICD-10-CM

## 2019-07-10 MED ORDER — DOXYCYCLINE HYCLATE 100 MG PO TABS: 100.0000 mg | ORAL_TABLET | Freq: Two times a day (BID) | ORAL | 0 refills | Status: AC

## 2019-07-10 NOTE — Patient Instructions (Addendum)
You had a small abscess above your left PIP joint. I am glad you were up to date on tetanus shot already as of 01/24/2019. We unroofed the abscess today and got some drainage. I want you to continue to use warm compresses perhaps 5-10 minutes multiple times a day. Hopefully this will continue to promote drainage- If going to be active cover with bandaid. If soaks through bandaid- replace.   If you have worsening joint pain around the rest of joint or it becomes red or swollen in those areas or the size of lesion expands in size- seek care over weekend.  Otherwise lets give antibiotic some time to work- generally within a few days hoping for some mild improvement.   If not improving by next week- my team can place a referral to hand surgery urgently (does not mean they will do surgery but they would be better for deeper incision then I feel like would be safe for me to do)   If you have fever, chills, nausea, vomiting, feel seek overall also seek care.   please text "COVID" to [phone], OR you can log on to [email or web address] to easily make an on-line appointment.

## 2019-07-10 NOTE — Progress Notes (Signed)
Phone [phone] In person visit   Subjective:   Rick Dixon is a 23 y.o. year old very pleasant male patient who presents for/with See problem oriented charting Chief Complaint  Patient presents with  . Follow-up  . finger infection    ROS-no fever/chills/nausea/vomiting.  This visit occurred during the SARS-CoV-2 public health emergency.  Safety protocols were in place, including screening questions prior to the visit, additional usage of staff PPE, and extensive cleaning of exam room while observing appropriate contact time as indicated for disinfecting solutions.   Past Medical History-  Patient Active Problem List   Diagnosis Date Noted  . Seasonal allergies   . Fracture of rib of right side with delayed healing 07/31/2013    Medications- reviewed and updated Current Outpatient Medications  Medication Sig Dispense Refill  . doxycycline (VIBRA-TABS) 100 MG tablet Take 1 tablet (100 mg total) by mouth 2 (two) times daily for 7 days. 14 tablet 0   No current facility-administered medications for this visit.     Objective:  BP 110/72   Pulse 94   Temp 98.5 F (36.9 C)   Ht 5\' 8"  (1.727 m)   Wt 177 lb (80.3 kg)   SpO2 97%   BMI 26.91 kg/m  Gen: NAD, resting comfortably  CV: RRR  Lungs: nonlabored, normal respiratory rate Abdomen: soft/nondistended  Skin: warm, dry MSK: Intact distal sensation.  Over the left index finger PIP joint there is approximately 1 x 1 cm raised area that appears slightly fluctuant.  This appears superficial to the joint-the rest of the joint does not appear red/warm/erythematous and is not tender.    Assessment and Plan   #COVID-19 testing-Pt also request COVID testing due to recent travel-this was ordered and instructed him how to get tested through [facility]  #Abscess of left index finger-patient is right-handed S: Patient was trying to remove some metal wire stuck in his tire.  When he pulled this out it cut his left index finger at  the PIP joint.  He had normal use of his finger after this and continue to try to work on the car.  Injury occurred approximately 2 weeks ago.  Patient is home from [place] for the holidays.  He states since the time of injury he has noted increasing pain, redness, protrusion from the side of the joint already had to cut and it just simply does not seem to heal.  He is worried about it being infected and wondered if he needs an antibiotic.  He is picked at it some and tried to express fluid-has had some purulent discharge.  TDAP up to date as of 01/24/2019.   He feels like the lesion has been stable over the last 3 days without worsening.  No significant pain when he does not touch the lesion but with palpation can get up to 6 out of 10 pain.  Aching type sensation A/P: Patient has a small abscess which appears to be superficial to the joint left index finger on the radial side approximately 1 x 1 cm.  He is right-handed patient wanted me to attempt to drain this today-I told him with how close this is to the joint I wanted to proceed cautiously.  Essentially we tried to unroof the scab and under this we were able to express some purulence.  We opted to place him on doxycycline.  If his symptoms worsen he should seek care over the weekend in the emergency room or if his symptoms fail to improve  within the next week-my team can refer him to hand surgery next week for their opinion and possible incision and drainage.  I do not think it is joint is infected as the rest of the joint appears normal and he has normal movement of the joint without significant pain but we discussed this was a risk that this could develop thus attempted drainage as well as antibiotics.  Also advised warm compresses  Lab/Order associations:   ICD-10-CM   1. Abscess of left index finger  L02.512   2. Encounter for screening laboratory testing for COVID-19 virus  Z20.828 Novel Coronavirus, NAA ([facility])    Meds ordered this  encounter  Medications  . doxycycline (VIBRA-TABS) 100 MG tablet    Sig: Take 1 tablet (100 mg total) by mouth 2 (two) times daily for 7 days.    Dispense:  14 tablet    Refill:  0    Return precautions advised.  Tana Conch, MD

## 2019-09-12 DIAGNOSIS — Z23 Encounter for immunization: Secondary | ICD-10-CM | POA: Diagnosis not present

## 2019-10-11 DIAGNOSIS — Z23 Encounter for immunization: Secondary | ICD-10-CM | POA: Diagnosis not present

## 2019-10-15 DIAGNOSIS — R5383 Other fatigue: Secondary | ICD-10-CM | POA: Diagnosis not present

## 2019-10-15 DIAGNOSIS — Z20828 Contact with and (suspected) exposure to other viral communicable diseases: Secondary | ICD-10-CM | POA: Diagnosis not present

## 2019-10-15 DIAGNOSIS — R519 Headache, unspecified: Secondary | ICD-10-CM | POA: Diagnosis not present

## 2019-11-11 ENCOUNTER — Ambulatory Visit: Payer: Self-pay | Admitting: Podiatry

## 2019-11-18 DIAGNOSIS — M722 Plantar fascial fibromatosis: Secondary | ICD-10-CM | POA: Diagnosis not present

## 2019-11-18 DIAGNOSIS — M79672 Pain in left foot: Secondary | ICD-10-CM | POA: Diagnosis not present

## 2019-12-12 ENCOUNTER — Telehealth: Payer: Self-pay

## 2019-12-12 ENCOUNTER — Ambulatory Visit (INDEPENDENT_AMBULATORY_CARE_PROVIDER_SITE_OTHER): Payer: Self-pay

## 2019-12-12 ENCOUNTER — Ambulatory Visit (INDEPENDENT_AMBULATORY_CARE_PROVIDER_SITE_OTHER): Payer: Self-pay | Admitting: Orthopaedic Surgery

## 2019-12-12 ENCOUNTER — Other Ambulatory Visit: Payer: Self-pay

## 2019-12-12 ENCOUNTER — Encounter: Payer: Self-pay | Admitting: Orthopaedic Surgery

## 2019-12-12 VITALS — Ht 68.0 in | Wt 170.0 lb

## 2019-12-12 DIAGNOSIS — M25561 Pain in right knee: Secondary | ICD-10-CM

## 2019-12-12 NOTE — Progress Notes (Signed)
Office Visit Note   Patient: Rick Dixon           Date of Birth: [birth year]           MRN: [id number] Visit Date: 12/12/2019              Requested by: Shelva Majestic, MD [address] [place],  [place] [address] PCP: Shelva Majestic, MD   Assessment & Plan: Visit Diagnoses:  1. Acute pain of right knee     Plan: Marcelo was playing basketball last night and was inadvertently hit by another player causing him to fall to the ground.  He had an injury to his right knee and he felt something "pop".  He does have a very small effusion but no evidence of instability.  He does have some pain in the posterior aspect of the knee medially and even some pain about the patella medially.  There is a possibility he may have had a patella subluxation.  He could have had a possible tear of the ACL or even the medial meniscus.  He is still having some difficulty ambulating I think it is worth obtaining an MRI scan.  He has had a prior ACL tear of his left knee 6 years ago and is doing well  Follow-Up Instructions: Return Schedule MRI scan right knee.   Orders:  Orders Placed This Encounter  Procedures  . XR KNEE 3 VIEW RIGHT  . MR Knee Right w/o contrast   No orders of the defined types were placed in this encounter.     Procedures: No procedures performed   Clinical Data: No additional findings.   Subjective: Chief Complaint  Patient presents with  . Right Knee - Pain, Injury    DOI 12/11/2019  Patient presents today for right knee pain. He was playing basketball yesterday evening and his knee gave way. He felt a pop. He is experiencing pain at the patella area and difficulty walking. His pain is worse with flexion and weightbearing. Minimally swollen. He has taken Advil for pain.  HPI  Review of Systems   Objective: Vital Signs: Ht 5\' 8"  (1.727 m)   Wt 170 lb (77.1 kg)   BMI 25.85 kg/m   Physical Exam Constitutional:      Appearance: He is well-developed.    Eyes:     Pupils: Pupils are equal, round, and reactive to light.  Pulmonary:     Effort: Pulmonary effort is normal.  Skin:    General: Skin is warm and dry.  Neurological:     Mental Status: He is alert and oriented to person, place, and time.  Psychiatric:        Behavior: Behavior normal.     Ortho Exam right knee with very small effusion and possibly hemarthrosis.  Full quick extension of flexed over 120 degrees.  No instability.  There was some tenderness about the patella medially but no apprehension.  There was also some posterior medial pain but no popping or clicking.  No opening with varus valgus stress and a negative anterior drawer sign.  Negative Lachman's test.  No popliteal pain.  No ecchymosis  Specialty Comments:  No specialty comments available.  Imaging: XR KNEE 3 VIEW RIGHT  Result Date: 12/12/2019 Films of the right knee return in three projections standing.  No evidence of any acute injury.  No fractures identified.  Joint spaces well-maintained    PMFS History: Patient Active Problem List   Diagnosis Date Noted  .  Pain in right knee 12/12/2019  . Seasonal allergies   . Fracture of rib of right side with delayed healing 07/31/2013   Past Medical History:  Diagnosis Date  . Biceps rupture, proximal    right  . Family history of adverse reaction to anesthesia    "My sister had a problem with a nerve block not sure what it was."  . PONV (postoperative nausea and vomiting)   . Seasonal allergies    claritin prn  . Wears contact lenses   . Wears glasses     Family History  Problem Relation Age of Onset  . Healthy Mother   . Healthy Father        overweight  . Healthy Sister   . Healthy Brother        colonoscopy at age 24-6- benign polyp  . Colon cancer Paternal Grandmother        at age 3  . Healthy Sister   . Arthritis Maternal Grandfather        osteo.     Past Surgical History:  Procedure Laterality Date  . ANTERIOR CRUCIATE LIGAMENT  REPAIR     related to wrestling. Dr. Durward Fortes  . SHOULDER ARTHROSCOPY WITH DEBRIDEMENT AND BICEP TENDON REPAIR Right 03/22/2018   Procedure: SHOULDER ARTHROSCOPY WITH LIMITED SUPERIOR LABRAL DEBRIDEMENT AND SUBPECTORAL BICEPS TENDODESIS;  Surgeon: Meredith Pel, MD;  Location: [facility];  Service: Orthopedics;  Laterality: Right;   Social History   Occupational History  . Not on file  Tobacco Use  . Smoking status: Never Smoker  . Smokeless tobacco: Never Used  Substance and Sexual Activity  . Alcohol use: Yes    Alcohol/week: 8.0 - 10.0 standard drinks    Types: 8 - 10 Standard drinks or equivalent per week    Comment: " a couple drinks a day"  . Drug use: No  . Sexual activity: Never

## 2019-12-12 NOTE — Telephone Encounter (Signed)
Anywhere is fine, Thanks!

## 2019-12-12 NOTE — Telephone Encounter (Signed)
Patient's mother called stating that patient had a fall last night and may have injured his right knee.  Would like to know if patient can be worked in this afternoon?   I can work him in at 1:15pm, if that's okay.  Just let me know.  CB# [phone].  Please advise.  Thank you.

## 2019-12-12 NOTE — Telephone Encounter (Signed)
Noted.  No problem. 

## 2019-12-17 ENCOUNTER — Telehealth: Payer: Self-pay | Admitting: Family Medicine

## 2019-12-17 NOTE — Telephone Encounter (Signed)
Patient has been scheduled for 12/19/19 at 9:20am

## 2019-12-17 NOTE — Telephone Encounter (Signed)
Patient is wanting to get scheduled for an appointment to discuss mental health issues and states he only lives in [place] part time but is available this week. Can we schedule for a same day slot for Thursday?

## 2019-12-17 NOTE — Telephone Encounter (Signed)
Yes and give emergency mental health numbers

## 2019-12-19 ENCOUNTER — Other Ambulatory Visit: Payer: Self-pay

## 2019-12-19 ENCOUNTER — Encounter: Payer: Self-pay | Admitting: Family Medicine

## 2019-12-19 ENCOUNTER — Ambulatory Visit (INDEPENDENT_AMBULATORY_CARE_PROVIDER_SITE_OTHER): Payer: BLUE CROSS/BLUE SHIELD | Admitting: Family Medicine

## 2019-12-19 VITALS — BP 102/64 | HR 72 | Temp 98.0°F | Ht 68.0 in | Wt 171.0 lb

## 2019-12-19 DIAGNOSIS — F418 Other specified anxiety disorders: Secondary | ICD-10-CM | POA: Diagnosis not present

## 2019-12-19 MED ORDER — ESCITALOPRAM OXALATE 10 MG PO TABS: 10.0000 mg | ORAL_TABLET | Freq: Every day | ORAL | 1 refills | Status: DC

## 2019-12-19 NOTE — Progress Notes (Addendum)
Phone [phone] In person visit   Subjective:   Rick Dixon is a 24 y.o. year old very pleasant male patient who presents for/with See problem oriented charting Chief Complaint  Patient presents with  . Anxiety    This visit occurred during the SARS-CoV-2 public health emergency.  Safety protocols were in place, including screening questions prior to the visit, additional usage of staff PPE, and extensive cleaning of exam room while observing appropriate contact time as indicated for disinfecting solutions.   Past Medical History-  Patient Active Problem List   Diagnosis Date Noted  . Pain in right knee 12/12/2019  . Seasonal allergies   . Fracture of rib of right side with delayed healing 07/31/2013    Medications- reviewed and updated, none prior to visit   Objective:  BP 102/64   Pulse 72   Temp 98 F (36.7 C) (Temporal)   Ht 5\' 8"  (1.727 m)   Wt 171 lb (77.6 kg)   SpO2 99%   BMI 26.00 kg/m  Gen: NAD, resting comfortably CV: RRR no murmurs rubs or gallops Lungs: CTAB no crackles, wheeze, rhonchi Ext: no edema, right knee brace in place Skin: warm, dry     Assessment and Plan   # Anxiety  S: has had history of anxiety in the past as long as he can remember. Has started having increased symptoms in the last 4 months- has a few friends in but not same level of networking he had in college plus covid 19 pandemic has locked things down. Has never been on any mediations for anxiety in the past. Has been seen by therapist in the past. Has been over two years but has appointment to restart in two weeks- teletherapy through [place]. Denies any suicidal thoughts. Some depressed moods.    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  GAD 7 : Generalized Anxiety Score 12/19/2019  Nervous, Anxious, on Edge 3  Control/stop worrying 3  Worry too much - different things 3  Trouble relaxing 2  Restless 2  Easily annoyed or irritable 1  Afraid -  awful might happen 2  Total GAD 7 Score 16  Anxiety Difficulty Somewhat difficult  A/P: 24 year old male with long-term issues with anxiety that has worsened recently over the last 4 months with covid 19 pandemic-will consider this situational anxiety for now.  We will treat with Lexapro 10 mg and recheck in 6 weeks.  He is also going to restart seeing a therapist which he has done in the past.  He has not been on medications in the past.  If he has new or worsening symptoms he will let 30 know.  #Right knee pain-started after playing basketball.  He has an upcoming MRI of this but unfortunately this could not be scheduled until June 28-this is a stressor for him as exercise is a fantastic outlet for him and anxiety.  I certainly wish I had the ability to move up the MRI but unfortunately I have had similar issues with scheduling MRIs recently. He enjoys running.   Recommended follow up: Return in about 6 weeks (around 01/30/2020) for follow up- or sooner if needed. Future Appointments  Date Time Provider Department Center  01/13/2020  5:40 PM [facility] MR 2 [facility]   Lab/Order associations:   ICD-10-CM   1. Situational anxiety  F41.8    Meds ordered this encounter  Medications  . escitalopram (LEXAPRO) 10 MG tablet    Sig: Take 1  tablet (10 mg total) by mouth daily.    Dispense:  30 tablet    Refill:  1   Return precautions advised.  Garret Reddish, MD

## 2019-12-19 NOTE — Addendum Note (Signed)
Addended by: Shelva Majestic on: 12/19/2019 09:55 AM   Modules accepted: Level of Service

## 2019-12-19 NOTE — Patient Instructions (Addendum)
Taking the medicine as directed and not missing any doses is one of the best things you can do to treat your anxiety.  Here are some things to keep in mind:  1) Side effects (stomach upset, some increased anxiety) may happen before you notice a benefit.  These side effects typically go away over time. 2) Changes to your dose of medicine or a change in medication all together is sometimes necessary 3) Most people need to be on medication at least 6-12 months 4) Many people will notice an improvement within two weeks but the full effect of the medication can take up to 4-6 weeks 5) Stopping the medication when you start feeling better often results in a return of symptoms 6) If you start having thoughts of hurting yourself or others after starting this medicine, call our office immediately at [phone] or seek care through 911.     Recommended follow up: Return in about 6 weeks (around 01/30/2020) for follow up- or sooner if needed.

## 2020-01-07 ENCOUNTER — Telehealth: Payer: Self-pay | Admitting: Orthopaedic Surgery

## 2020-01-07 NOTE — Telephone Encounter (Signed)
Called patient's mother to set up MRI review.   Gave them the next available but she is afraid this will cause a schedule conflict with an upcoming trip of theirs. Wants to know if he can be seen sooner than 7/7  Call back: [phone]

## 2020-01-07 NOTE — Telephone Encounter (Signed)
Spoke with patient's mother. Rescheduled appointment.

## 2020-01-13 ENCOUNTER — Ambulatory Visit
Admission: RE | Admit: 2020-01-13 | Discharge: 2020-01-13 | Disposition: A | Discharge status: Home / Self Care | Payer: BLUE CROSS/BLUE SHIELD | Source: Ambulatory Visit | Attending: Orthopaedic Surgery | Admitting: Orthopaedic Surgery

## 2020-01-13 DIAGNOSIS — M25561 Pain in right knee: Secondary | ICD-10-CM

## 2020-01-14 ENCOUNTER — Telehealth: Payer: Self-pay

## 2020-01-14 NOTE — Telephone Encounter (Signed)
Confirmed appt.  Advised results are in Epic and will be ready for appt when he arrives.

## 2020-01-14 NOTE — Telephone Encounter (Signed)
Patient was calling back says he received a missed call and wanted to speak about upcoming appt

## 2020-01-16 ENCOUNTER — Ambulatory Visit: Payer: BLUE CROSS/BLUE SHIELD | Admitting: Orthopaedic Surgery

## 2020-01-16 ENCOUNTER — Other Ambulatory Visit: Payer: Self-pay

## 2020-01-16 ENCOUNTER — Encounter: Payer: Self-pay | Admitting: Orthopaedic Surgery

## 2020-01-16 DIAGNOSIS — M25561 Pain in right knee: Secondary | ICD-10-CM

## 2020-01-16 NOTE — Progress Notes (Signed)
Office Visit Note   Patient: Rick Dixon           Date of Birth: 1996/03/26           MRN: [id number] Visit Date: 01/16/2020              Requested by: Rick Majestic, MD [address] [place],  [place] [address] PCP: Rick Majestic, MD   Assessment & Plan: Visit Diagnoses:  1. Acute pain of right knee     Plan: 6 weeks status post injury to right knee and still having some discomfort about the patella with occasional catching and popping.  No obvious instability.  MRI scan demonstrates the only pathology being referable to the patella where there was a 0.4 cm area of partial thickness cartilage loss along the posterior ridge.  There was some subchondral edema.  Collateral and cruciate ligaments intact.  Both menisci intact.  Medial lateral compartments were clear of any problems.  Long discussion with Rick Dixon regarding all the above.  He can work on quad exercises.  He might want to avoid hyperflexion activities.  We will plan to see him back as needed.  He is going to work this year in [place]  Follow-Up Instructions: Return if symptoms worsen or fail to improve.   Orders:  No orders of the defined types were placed in this encounter.  No orders of the defined types were placed in this encounter.     Procedures: No procedures performed   Clinical Data: No additional findings.   Subjective: Chief Complaint  Patient presents with  . Right Knee - Pain  6 weeks status post right knee injury and still having a little trouble referable to the patella.  No instability.  MRI scan results are as above  HPI  Review of Systems   Objective: Vital Signs: There were no vitals taken for this visit.  Physical Exam Constitutional:      Appearance: He is well-developed.  Eyes:     Pupils: Pupils are equal, round, and reactive to light.  Pulmonary:     Effort: Pulmonary effort is normal.  Skin:    General: Skin is warm and dry.  Neurological:     Mental  Status: He is alert and oriented to person, place, and time.  Psychiatric:        Behavior: Behavior normal.     Ortho Exam awake alert and oriented x3.  Comfortable sitting examination of the right knee revealed no evidence of any instability with a varus or valgus stress.  No effusion.  Negative Lachman's and anterior drawer sign.  No medial lateral joint pain.  Little bit of pain with patella compression no pain about the medial patellofemoral ligament.  Full extension and flexion over 120 degrees.  No popliteal pain or mass.  No calf pain.  Neurologically intact  Specialty Comments:  No specialty comments available.  Imaging: No results found.   PMFS History: Patient Active Problem List   Diagnosis Date Noted  . Pain in right knee 12/12/2019  . Seasonal allergies   . Fracture of rib of right side with delayed healing 07/31/2013   Past Medical History:  Diagnosis Date  . Biceps rupture, proximal    right  . Family history of adverse reaction to anesthesia    "My sister had a problem with a nerve block not sure what it was."  . PONV (postoperative nausea and vomiting)   . Seasonal allergies    claritin prn  .  Wears contact lenses   . Wears glasses     Family History  Problem Relation Age of Onset  . Healthy Mother   . Healthy Father        overweight  . Healthy Sister   . Healthy Brother        colonoscopy at age 7-6- benign polyp  . Colon cancer Paternal Grandmother        at age 61  . Healthy Sister   . Arthritis Maternal Grandfather        osteo.     Past Surgical History:  Procedure Laterality Date  . ANTERIOR CRUCIATE LIGAMENT REPAIR     related to wrestling. Dr. Cleophas Dunker  . SHOULDER ARTHROSCOPY WITH DEBRIDEMENT AND BICEP TENDON REPAIR Right 03/22/2018   Procedure: SHOULDER ARTHROSCOPY WITH LIMITED SUPERIOR LABRAL DEBRIDEMENT AND SUBPECTORAL BICEPS TENDODESIS;  Surgeon: Cammy Copa, MD;  Location: [facility] OR;  Service: Orthopedics;  Laterality: Right;    Social History   Occupational History  . Not on file  Tobacco Use  . Smoking status: Never Smoker  . Smokeless tobacco: Never Used  Vaping Use  . Vaping Use: Some days  Substance and Sexual Activity  . Alcohol use: Yes    Alcohol/week: 8.0 - 10.0 standard drinks    Types: 8 - 10 Standard drinks or equivalent per week    Comment: " a couple drinks a day"  . Drug use: No  . Sexual activity: Never     Valeria Batman, MD   Note - This record has been created using [employer].  Chart creation errors have been sought, but may not always  have been located. Such creation errors do not reflect on  the standard of medical care.

## 2020-01-21 ENCOUNTER — Ambulatory Visit: Payer: BLUE CROSS/BLUE SHIELD | Admitting: Orthopaedic Surgery

## 2020-01-27 ENCOUNTER — Ambulatory Visit: Payer: BLUE CROSS/BLUE SHIELD | Admitting: Family Medicine

## 2020-02-20 ENCOUNTER — Other Ambulatory Visit: Payer: Self-pay | Admitting: Family Medicine

## 2020-02-27 DIAGNOSIS — Z20828 Contact with and (suspected) exposure to other viral communicable diseases: Secondary | ICD-10-CM | POA: Diagnosis not present

## 2020-03-18 DIAGNOSIS — H52223 Regular astigmatism, bilateral: Secondary | ICD-10-CM | POA: Diagnosis not present

## 2020-03-30 ENCOUNTER — Telehealth: Payer: Self-pay | Admitting: Family Medicine

## 2020-03-30 MED ORDER — ESCITALOPRAM OXALATE 10 MG PO TABS: 10.0000 mg | ORAL_TABLET | Freq: Every day | ORAL | 5 refills | Status: DC

## 2020-03-30 NOTE — Telephone Encounter (Signed)
Initial Comment [name] states he need a prescription refill. Lexapro 10 mg and he doesn't have any refills left. no symptoms Translation No Nurse Assessment Nurse: Ardis Hughs, RN, Crystal Date/Time (Eastern Time): 03/29/2020 12:21:47 PM Please select the assessment type ---Refill Does the patient have enough medication to last until the office opens? ---No Nurse: Ardis Hughs, RN, Crystal Date/Time (Eastern Time): 03/29/2020 12:20:17 PM Confirm and document reason for call. If symptomatic, describe symptoms. ---[name] states he need a prescription refill. Lexapro 10 mg and he doesn't have any refills left, he is completely out of medication. Has the patient had close contact with a person known or suspected to have the novel coronavirus illness OR traveled / lives in area with major community spread (including international travel) in the last 14 days from the onset of symptoms? * If Asymptomatic, screen for exposure and travel within the last 14 days. ---No Does the patient have any new or worsening symptoms? ---No Please document clinical information provided and list any resource used. ---Advised caller to ask for a loaner dose of medication, if pharmacy is unwilling to give loaner dose call back. [name] verbalized understanding. Disp. Time Rick Dixon Time) Disposition Final User 03/29/2020 12:25:12 PM Clinical Call Yes [place], RN, [name]

## 2020-03-30 NOTE — Telephone Encounter (Signed)
Medication has been refilled.

## 2020-05-04 ENCOUNTER — Other Ambulatory Visit: Payer: Self-pay

## 2020-05-04 ENCOUNTER — Other Ambulatory Visit: Payer: Self-pay | Admitting: Family Medicine

## 2020-05-04 ENCOUNTER — Telehealth: Payer: Self-pay

## 2020-05-04 MED ORDER — ESCITALOPRAM OXALATE 10 MG PO TABS
10.0000 mg | ORAL_TABLET | Freq: Every day | ORAL | 5 refills | Status: DC
Start: 2020-05-04 — End: 2020-11-30

## 2020-05-04 NOTE — Telephone Encounter (Signed)
Pt is requesting a lexapro refill that should be sent to [facility] in [place].   Pt states he is already out of this medication and greatly benefits from it

## 2020-05-04 NOTE — Telephone Encounter (Signed)
[facility] in [place], pt made aware.

## 2020-11-29 ENCOUNTER — Other Ambulatory Visit: Payer: Self-pay | Admitting: Family Medicine

## 2020-12-03 ENCOUNTER — Telehealth: Payer: Self-pay

## 2020-12-03 NOTE — Telephone Encounter (Signed)
Patient is calling in stating that he would like to stop his Lexapro, looked online and seen he will need to wean off the medication and would like to do so but doesn't know how.

## 2020-12-03 NOTE — Telephone Encounter (Signed)
Please schedule pt a virtual or OV to discuss this with Dr. Hunter.  °

## 2020-12-03 NOTE — Telephone Encounter (Signed)
Patient is scheduled   

## 2020-12-10 ENCOUNTER — Telehealth: Payer: BLUE CROSS/BLUE SHIELD | Admitting: Family Medicine

## 2020-12-10 NOTE — Progress Notes (Signed)
We were unable to connect with patient-attempted 3 times to reach by phone.  Happy to reschedule to discuss concerns

## 2021-01-28 ENCOUNTER — Ambulatory Visit: Payer: Self-pay

## 2021-02-08 ENCOUNTER — Ambulatory Visit: Payer: BLUE CROSS/BLUE SHIELD | Admitting: Family Medicine

## 2021-02-17 ENCOUNTER — Encounter: Payer: Self-pay | Admitting: Family Medicine

## 2021-06-12 DIAGNOSIS — H52223 Regular astigmatism, bilateral: Secondary | ICD-10-CM | POA: Diagnosis not present

## 2021-06-30 DIAGNOSIS — Z20822 Contact with and (suspected) exposure to covid-19: Secondary | ICD-10-CM | POA: Diagnosis not present

## 2021-06-30 DIAGNOSIS — B349 Viral infection, unspecified: Secondary | ICD-10-CM | POA: Diagnosis not present

## 2021-06-30 DIAGNOSIS — R1110 Vomiting, unspecified: Secondary | ICD-10-CM | POA: Diagnosis not present

## 2021-07-29 ENCOUNTER — Telehealth (INDEPENDENT_AMBULATORY_CARE_PROVIDER_SITE_OTHER): Payer: 59 | Admitting: Family Medicine

## 2021-07-29 ENCOUNTER — Encounter: Payer: Self-pay | Admitting: Family Medicine

## 2021-07-29 VITALS — Ht 68.0 in | Wt 175.0 lb

## 2021-07-29 DIAGNOSIS — F411 Generalized anxiety disorder: Secondary | ICD-10-CM | POA: Diagnosis not present

## 2021-07-29 MED ORDER — ESCITALOPRAM OXALATE 20 MG PO TABS: 20.0000 mg | ORAL_TABLET | Freq: Every day | ORAL | 5 refills | Status: DC

## 2021-07-29 NOTE — Patient Instructions (Signed)
Health Maintenance Due  Topic Date Due   HPV VACCINES (1 - Male 2-dose series) Never done   Hepatitis C Screening  Never done   COVID-19 Vaccine (2 - Pfizer series) 06/26/2020   INFLUENZA VACCINE  02/15/2021    Recommended follow up: No follow-ups on file.

## 2021-07-29 NOTE — Assessment & Plan Note (Signed)
#   Anxiety/slightly depressed mood S:Medication: none in the past. Lexapro 10 mg daily - patient has not been taking this for the past 6 months - around agugust- had been on for a year. No side effects on the med but did have some side effects coming off.   About a month ago noted a flatness about himself that is atypical for him. He honestly thinks he may need a new job. He is doing some long runs after work. Trying to get adequate sleep  Counseling: starting soon- appointment tonight . Did work with therapist in [place].  Depression screen [facility] 2/9 07/29/2021 12/19/2019 07/10/2019  Decreased Interest 1 0 0  Down, Depressed, Hopeless 1 - 0  PHQ - 2 Score 2 0 0  Altered sleeping 1 2 -  Tired, decreased energy 1 1 -  Change in appetite 0 0 -  Feeling bad or failure about yourself  1 1 -  Trouble concentrating 0 0 -  Moving slowly or fidgety/restless 0 1 -  Suicidal thoughts 0 0 -  PHQ-9 Score 5 5 -  Difficult doing work/chores Somewhat difficult - -   GAD 7 : Generalized Anxiety Score 07/29/2021 12/19/2019  Nervous, Anxious, on Edge 2 3  Control/stop worrying 3 3  Worry too much - different things 3 3  Trouble relaxing 0 2  Restless 1 2  Easily annoyed or irritable 2 1  Afraid - awful might happen 1 2  Total GAD 7 Score 12 16  Anxiety Difficulty Somewhat difficult Somewhat difficult   A/P: 26 year old male with what I would now label as GAD that is poorly controlled-who had some improvement on Lexapro 10 mg and what we called at that time situational anxiety presenting with worsening anxiety after stopping medication.  When he reflects that he feels like he did not have full benefit from medication-he reports he and his therapist mention this.  We opted to try 20 mg.  He did notice some difficulties with erections and apparently has tried medication like Viagra which has been helpful-I did discuss with him there could be some level of mental block but he feels like there is significant  worsening of issues on the 20 mg dose we could consider alternate like Pristiq or if he simply does not feel full benefit on Lexapro.  We also reviewed the following:  Taking the medicine as directed and not missing any doses is one of the best things you can do to treat your anxiety.  Here are some things to keep in mind:  1) Side effects (stomach upset, some increased anxiety) may happen before you notice a benefit.  These side effects typically go away over time. 2) Changes to your dose of medicine or a change in medication all together is sometimes necessary 3) Most people need to be on medication at least 6-12 months 4) Many people will notice an improvement within two weeks but the full effect of the medication can take up to 4-6 weeks 5) Stopping the medication when you start feeling better often results in a return of symptoms 6) If you start having thoughts of hurting yourself or others after starting this medicine, call our office immediately at [phone] or seek care through 911.

## 2021-07-29 NOTE — Progress Notes (Signed)
Phone [phone] Virtual visit via Video note   Subjective:  Chief complaint: Chief Complaint  Patient presents with   Follow-up    Pt would like to discuss Lexapro since he took it for a year and stopped taking it or if he needs to start a new medication.   This visit type was conducted due to national recommendations for restrictions regarding the COVID-19 Pandemic (e.g. social distancing).  This format is felt to be most appropriate for this patient at this time balancing risks to patient and risks to population by having him in for in person visit.  No physical exam was performed (except for noted visual exam or audio findings with Telehealth visits).    Our team/I connected with Lavina Hamman at  8:20 AM EST by a video enabled telemedicine application (doxy.me or caregility through epic) and verified that I am speaking with the correct person using two identifiers.  Location patient: [place] Location provider: [facility], office Persons participating in the virtual visit:  patient  Our team/I discussed the limitations of evaluation and management by telemedicine and the availability of in person appointments. In light of current covid-19 pandemic, patient also understands that we are trying to protect them by minimizing in office contact if at all possible.  The patient expressed consent for telemedicine visit and agreed to proceed. Patient understands insurance will be billed.   Past Medical History-  Patient Active Problem List   Diagnosis Date Noted   GAD (generalized anxiety disorder) 07/29/2021    Priority: Medium    Pain in right knee 12/12/2019   Seasonal allergies    Fracture of rib of right side with delayed healing 07/31/2013    Medications- reviewed and updated Current Outpatient Medications  Medication Sig Dispense Refill   escitalopram (LEXAPRO) 20 MG tablet Take 1 tablet (20 mg total) by mouth daily. 30 tablet 5   No current facility-administered medications  for this visit.     Objective:  Ht 5\' 8"  (1.727 m)    Wt 175 lb (79.4 kg)    BMI 26.61 kg/m  self reported vitals Gen: NAD, resting comfortably Lungs: nonlabored, normal respiratory rate  Skin: appears dry, no obvious rash     Assessment and Plan   #social update- working in now in [employer]- work load is not quite the quantity he was expecting. 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 Doing a lot of reading/writing  # Anxiety/slightly depressed mood S:Medication: none in the past. Lexapro 10 mg daily - patient has not been taking this for the past 6 months - around agugust- had been on for a year. No side effects on the med but did have some side effects coming off.   About a month ago noted a flatness about himself that is atypical for him. He honestly thinks he may need a new job. He is doing some long runs after work. Trying to get adequate sleep  Counseling: starting soon- appointment tonight . Did work with therapist in [place].  Depression screen [facility] 2/9 07/29/2021 12/19/2019 07/10/2019  Decreased Interest 1 0 0  Down, Depressed, Hopeless 1 - 0  PHQ - 2 Score 2 0 0  Altered sleeping 1 2 -  Tired, decreased energy 1 1 -  Change in appetite 0 0 -  Feeling bad or failure about yourself  1 1 -  Trouble concentrating 0 0 -  Moving slowly or fidgety/restless 0 1 -  Suicidal thoughts 0 0 -  PHQ-9 Score 5 5 -  Difficult doing work/chores Somewhat difficult - -   GAD 7 : Generalized Anxiety Score 07/29/2021 12/19/2019  Nervous, Anxious, on Edge 2 3  Control/stop worrying 3 3  Worry too much - different things 3 3  Trouble relaxing 0 2  Restless 1 2  Easily annoyed or irritable 2 1  Afraid - awful might happen 1 2  Total GAD 7 Score 12 16  Anxiety Difficulty Somewhat difficult Somewhat difficult   A/P: 26 year old male with what I would now label as GAD that is poorly controlled-who had some improvement on Lexapro 10 mg and what we called at that time  situational anxiety presenting with worsening anxiety after stopping medication.  When he reflects that he feels like he did not have full benefit from medication-he reports he and his therapist mention this.  We opted to try 20 mg.  He did notice some difficulties with erections and apparently has tried medication like Viagra which has been helpful-I did discuss with him there could be some level of mental block but he feels like there is significant worsening of issues on the 20 mg dose we could consider alternate like Pristiq or if he simply does not feel full benefit on Lexapro.  We also reviewed the following:  Taking the medicine as directed and not missing any doses is one of the best things you can do to treat your anxiety.  Here are some things to keep in mind:  Side effects (stomach upset, some increased anxiety) may happen before you notice a benefit.  These side effects typically go away over time. Changes to your dose of medicine or a change in medication all together is sometimes necessary Most people need to be on medication at least 6-12 months Many people will notice an improvement within two weeks but the full effect of the medication can take up to 4-6 weeks Stopping the medication when you start feeling better often results in a return of symptoms If you start having thoughts of hurting yourself or others after starting this medicine, call our office immediately at [phone] or seek care through 911.       Recommended follow up: Recommended 57-month follow-up  Lab/Order associations:   ICD-10-CM   1. GAD (generalized anxiety disorder)  F41.1       Meds ordered this encounter  Medications   escitalopram (LEXAPRO) 20 MG tablet    Sig: Take 1 tablet (20 mg total) by mouth daily.    Dispense:  30 tablet    Refill:  5    Return precautions advised.  Tana Conch, MD

## 2021-08-12 ENCOUNTER — Telehealth: Payer: BLUE CROSS/BLUE SHIELD | Admitting: Family Medicine

## 2021-09-16 ENCOUNTER — Telehealth: Payer: Self-pay | Admitting: Family Medicine

## 2021-09-16 NOTE — Telephone Encounter (Signed)
Most commonly I have seen therapists write these letters but not all therapist write them-has he happened to check with his? ? ?He is due for his 43-month follow-up regardless so reasonable to schedule visit for follow-up and we can touch base on this if his therapist does not write such letters ?

## 2021-09-16 NOTE — Telephone Encounter (Signed)
See below

## 2021-09-16 NOTE — Telephone Encounter (Signed)
Patient stated he would like a letter from Dr Durene Cal. Stated he needs/would like an Radiographer, therapeutic. Requested a letter with Dx. Patient can come in for an apt if needed.  ?

## 2021-09-17 ENCOUNTER — Telehealth: Payer: Self-pay | Admitting: Family Medicine

## 2021-09-17 NOTE — Telephone Encounter (Signed)
See below, please schedule pt for 2 month f/u. ?

## 2021-09-20 NOTE — Telephone Encounter (Signed)
Pt has called back in and stated his therapist is through a virtual platform. [employer] will only accept the letter from an MD. He is needing the letter asap or he will be evicted. He is on a flight until 3pm today but a detailed msg can be left if needed. Please advise. ?

## 2021-09-20 NOTE — Telephone Encounter (Signed)
Patient called and stated [place] is [place] and not able to come in for an appointment. ?

## 2021-09-20 NOTE — Telephone Encounter (Signed)
See below

## 2021-09-20 NOTE — Telephone Encounter (Signed)
To whom it may concern, ? ?Patient has generalized anxiety disorder and is being treated with both medication and counseling.  He would benefit from the support of an emotional support animal.  I have a follow-up visit scheduled with him on March 30 to discuss his progress as his primary care physician. ? ?Thank you for your help and attention with this matter, ?Tana Conch, MD ? ?

## 2021-09-21 NOTE — Telephone Encounter (Signed)
Called to make pt aware that letter has been written and visible via mychart, however pt has a vm that has not been setup. ?

## 2021-09-22 NOTE — Progress Notes (Incomplete)
? ?Phone [phone] ?In person visit ?  ?Subjective:  ? ?Rick Dixon is a 26 y.o. year old very pleasant male patient who presents for/with See problem oriented charting ?No chief complaint on file. ? ? ?This visit occurred during the SARS-CoV-2 public health emergency.  Safety protocols were in place, including screening questions prior to the visit, additional usage of staff PPE, and extensive cleaning of exam room while observing appropriate contact time as indicated for disinfecting solutions.  ? ?Past Medical History-  ?Patient Active Problem List  ? Diagnosis Date Noted  ? GAD (generalized anxiety disorder) 07/29/2021  ? Pain in right knee 12/12/2019  ? Seasonal allergies   ? Fracture of rib of right side with delayed healing 07/31/2013  ? ? ?Medications- reviewed and updated ?Current Outpatient Medications  ?Medication Sig Dispense Refill  ? escitalopram (LEXAPRO) 20 MG tablet Take 1 tablet (20 mg total) by mouth daily. 30 tablet 5  ? ?No current facility-administered medications for this visit.  ? ?  ?Objective:  ?There were no vitals taken for this visit. ?Gen: NAD, resting comfortably ?CV: RRR no murmurs rubs or gallops ?Lungs: CTAB no crackles, wheeze, rhonchi ?Abdomen: soft/nontender/nondistended/normal bowel sounds. No rebound or guarding.  ?Ext: no edema ?Skin: warm, dry ?Neuro: grossly normal, moves all extremities ? ?*** ?  ? ?Assessment and Plan  ?  ?#social update- working in [employer] now in [employer]- work load was not quite the quantity he was expecting. 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 Doing a lot of reading/writing ?  ?# Anxiety/slightly depressed mood ?S:Medication: none in the past. Lexapro 20 mg daily -  ?-Lexapro 10 mg in the past, patient had not been taking this for the past 6 months - around August 2022- had been on for a year. No side effects on the med but did have some side effects coming off.  ?  ?Around Dec 2022, noted a flatness about himself that is  atypical for him. He honestly thought he may need a new job. He was doing some long runs after work. Tried to get adequate sleep ?  ?Counseling: starting soon- appointment tonight . Did work with therapist in [place].  ? ?-I did discuss with him there could be some level of mental block but he felt like there was significant worsened of issues on the 20 mg dose we could consider alternate like Pristiq or if he simply does not feel full benefit on Lexapro. ?Depression screen [facility] 2/9 07/29/2021 12/19/2019 07/10/2019  ?Decreased Interest 1 0 0  ?Down, Depressed, Hopeless 1 - 0  ?PHQ - 2 Score 2 0 0  ?Altered sleeping 1 2 -  ?Tired, decreased energy 1 1 -  ?Change in appetite 0 0 -  ?Feeling bad or failure about yourself  1 1 -  ?Trouble concentrating 0 0 -  ?Moving slowly or fidgety/restless 0 1 -  ?Suicidal thoughts 0 0 -  ?PHQ-9 Score 5 5 -  ?Difficult doing work/chores Somewhat difficult - -  ? ?GAD 7 : Generalized Anxiety Score 07/29/2021 12/19/2019  ?Nervous, Anxious, on Edge 2 3  ?Control/stop worrying 3 3  ?Worry too much - different things 3 3  ?Trouble relaxing 0 2  ?Restless 1 2  ?Easily annoyed or irritable 2 1  ?Afraid - awful might happen 1 2  ?Total GAD 7 Score 12 16  ?Anxiety Difficulty Somewhat difficult Somewhat difficult  ? ?A/P: *** ?  ?Health Maintenance Due  ?Topic Date Due  ?  HPV VACCINES (1 - Male 2-dose series) Never done  ? Hepatitis C Screening  Never done  ? COVID-19 Vaccine (2 - Pfizer series) 06/26/2020  ? INFLUENZA VACCINE  02/15/2021  ? ?Recommended follow up: No follow-ups on file. ?Future Appointments  ?Date Time Provider Department Center  ?10/14/2021  9:20 AM Durene Cal, Aldine Contes, MD [facility]  ? ? ?Lab/Order associations: ?No diagnosis found. ? ?No orders of the defined types were placed in this encounter. ? ? ?I,Jada Bradford,acting as a scribe for Tana Conch, MD.,have documented all relevant documentation on the behalf of Tana Conch, MD,as directed by  Tana Conch, MD while  in the presence of Tana Conch, MD. ? ?*** ?Return precautions advised.  ?Scheryl Marten ? ? ?

## 2021-10-14 ENCOUNTER — Ambulatory Visit: Payer: 59 | Admitting: Family Medicine

## 2021-10-14 DIAGNOSIS — F411 Generalized anxiety disorder: Secondary | ICD-10-CM

## 2021-11-29 NOTE — Telephone Encounter (Signed)
CLOSING NOTE. ?

## 2022-01-05 ENCOUNTER — Other Ambulatory Visit: Payer: Self-pay | Admitting: Family Medicine

## 2022-03-12 ENCOUNTER — Other Ambulatory Visit: Payer: Self-pay | Admitting: Family Medicine

## 2022-04-11 ENCOUNTER — Encounter: Payer: Self-pay | Admitting: *Deleted

## 2022-06-09 ENCOUNTER — Other Ambulatory Visit: Payer: Self-pay | Admitting: Family Medicine

## 2022-06-30 ENCOUNTER — Encounter: Payer: Self-pay | Admitting: *Deleted

## 2022-12-19 ENCOUNTER — Ambulatory Visit: Payer: 59 | Admitting: Family Medicine

## 2023-03-20 ENCOUNTER — Other Ambulatory Visit: Payer: Self-pay | Admitting: Family Medicine

## 2023-06-21 ENCOUNTER — Other Ambulatory Visit: Payer: Self-pay | Admitting: Family Medicine

## 2023-07-21 ENCOUNTER — Ambulatory Visit (INDEPENDENT_AMBULATORY_CARE_PROVIDER_SITE_OTHER): Payer: 59 | Admitting: Family

## 2023-07-21 VITALS — BP 108/64 | HR 71 | Temp 97.7°F | Ht 68.0 in | Wt 180.4 lb

## 2023-07-21 DIAGNOSIS — B009 Herpesviral infection, unspecified: Secondary | ICD-10-CM

## 2023-07-21 DIAGNOSIS — F411 Generalized anxiety disorder: Secondary | ICD-10-CM | POA: Diagnosis not present

## 2023-07-21 MED ORDER — ESCITALOPRAM OXALATE 10 MG PO TABS: 15.0000 mg | ORAL_TABLET | Freq: Every day | ORAL | 2 refills | Status: DC

## 2023-07-21 MED ORDER — VALACYCLOVIR HCL 1 G PO TABS: 2000.0000 mg | ORAL_TABLET | Freq: Two times a day (BID) | ORAL | 0 refills | Status: AC

## 2023-07-21 NOTE — Progress Notes (Signed)
 Patient ID: Rick Dixon, male    DOB: 01-23-1996, 28 y.o.   MRN: [id number]  Chief Complaint  Patient presents with   Medication Refill    Pt here for refill on Lexapro     Mouth Lesions    Pt would like to discuss cold sore medication - pt gets cold sores once very 3 months that will not go away        Discussed the use of AI scribe software for clinical note transcription with the patient, who gave verbal consent to proceed.  History of Present Illness   The patient, with a history of anxiety and recurrent HSV-1 cold sores, presents for a medication refill. He has been managing his anxiety with Lexapro  (escitalopram ) 15mg  daily, which he finds effective and has no desire to change. He has been titrating the dose himself, starting at 20mg , reducing to 15mg , and briefly trying 10mg , which he found insufficient.  In addition, he experiences recurrent HSV-1 cold sores approximately every three months. He has previously used Valtrex  (valacyclovir ) to manage these outbreaks, which he finds effective if taken early enough at the onset of symptoms. However, he has not had access to this medication recently due to the high cost through an therapist, occupational.     Assessment & Plan:     Anxiety - Well controlled on Lexapro  15mg  daily. Patient has titrated to the lowest effective dose. -Continue Lexapro  15mg  daily. -Refill prescription for a 90-day supply. -F/U with PCP in 1 yr or for CPE sooner.  Herpes Simplex Virus (HSV-1) - Recurrent cold sores every 3 months. Previously treated with Valtrex  with good response. -Prescribe Valtrex  2000mg  twice daily at the onset of symptoms. -Refill prescription for future outbreaks, #30 pills. -F/U w/PCP prn.     Subjective:    Outpatient Medications Prior to Visit  Medication Sig Dispense Refill   escitalopram  (LEXAPRO ) 20 MG tablet TAKE 1 TABLET DAILY 90 tablet 3   No facility-administered medications prior to visit.   Past Medical History:   Diagnosis Date   Biceps rupture, proximal    right   Family history of adverse reaction to anesthesia    My sister had a problem with a nerve block not sure what it was.   PONV (postoperative nausea and vomiting)    Seasonal allergies    claritin prn   Wears contact lenses    Wears glasses    Past Surgical History:  Procedure Laterality Date   ANTERIOR CRUCIATE LIGAMENT REPAIR     related to wrestling. Dr. Anderson   SHOULDER ARTHROSCOPY WITH DEBRIDEMENT AND BICEP TENDON REPAIR Right 03/22/2018   Procedure: SHOULDER ARTHROSCOPY WITH LIMITED SUPERIOR LABRAL DEBRIDEMENT AND SUBPECTORAL BICEPS TENDODESIS;  Surgeon: Addie Cordella Hamilton, MD;  Location: [facility] OR;  Service: Orthopedics;  Laterality: Right;   No Known Allergies    Objective:    Physical Exam Vitals and nursing note reviewed.  Constitutional:      General: He is not in acute distress.    Appearance: Normal appearance.  HENT:     Head: Normocephalic.  Cardiovascular:     Rate and Rhythm: Normal rate and regular rhythm.  Pulmonary:     Effort: Pulmonary effort is normal.     Breath sounds: Normal breath sounds.  Musculoskeletal:        General: Normal range of motion.     Cervical back: Normal range of motion.  Skin:    General: Skin is warm and dry.  Neurological:  Mental Status: He is alert and oriented to person, place, and time.  Psychiatric:        Mood and Affect: Mood normal.    BP 108/64   Pulse 71   Temp 97.7 F (36.5 C)   Ht 5' 8 (1.727 m)   Wt 180 lb 6.4 oz (81.8 kg)   SpO2 98%   BMI 27.43 kg/m  Wt Readings from Last 3 Encounters:  07/21/23 180 lb 6.4 oz (81.8 kg)  07/29/21 175 lb (79.4 kg)  12/19/19 171 lb (77.6 kg)       Rick Krabbe, NP

## 2024-01-04 ENCOUNTER — Other Ambulatory Visit: Payer: Self-pay

## 2024-01-04 ENCOUNTER — Other Ambulatory Visit: Payer: Self-pay | Admitting: Family Medicine

## 2024-01-04 DIAGNOSIS — F411 Generalized anxiety disorder: Secondary | ICD-10-CM

## 2024-01-04 MED ORDER — ESCITALOPRAM OXALATE 10 MG PO TABS: 15.0000 mg | ORAL_TABLET | Freq: Every day | ORAL | 2 refills | Status: DC

## 2024-01-04 MED ORDER — ESCITALOPRAM OXALATE 10 MG PO TABS: 15.0000 mg | ORAL_TABLET | Freq: Every day | ORAL | 2 refills | Status: AC

## 2024-01-04 NOTE — Telephone Encounter (Signed)
 Copied from CRM [id number]. Topic: Clinical - Medication Refill >> Jan 04, 2024 10:43 AM Bambi Bonine D wrote: Medication: escitalopram  (LEXAPRO ) 10 MG tablet  Has the patient contacted their pharmacy? Yes (Agent: If no, request that the patient contact the pharmacy for the refill. If patient does not wish to contact the pharmacy document the reason why and proceed with request.) (Agent: If yes, when and what did the pharmacy advise?)  This is the patient's preferred pharmacy:  [facility] [place], [place] - [address] [facility] [address] [place] [address] Phone: [phone] Fax: [phone]   Is this the correct pharmacy for this prescription? Yes If no, delete pharmacy and type the correct one.   Has the prescription been filled recently? No  Is the patient out of the medication? No  Has the patient been seen for an appointment in the last year OR does the patient have an upcoming appointment? Yes  Can we respond through MyChart? No  Agent: Please be advised that Rx refills may take up to 3 business days. We ask that you follow-up with your pharmacy.

## 2024-01-24 ENCOUNTER — Other Ambulatory Visit: Payer: Self-pay | Admitting: Family

## 2024-02-23 ENCOUNTER — Telehealth: Payer: Self-pay | Admitting: Family Medicine

## 2024-02-23 DIAGNOSIS — F411 Generalized anxiety disorder: Secondary | ICD-10-CM

## 2024-02-23 MED ORDER — ESCITALOPRAM OXALATE 10 MG PO TABS: 15.0000 mg | ORAL_TABLET | Freq: Every day | ORAL | 2 refills | Status: AC

## 2024-02-23 NOTE — Telephone Encounter (Signed)
 Copied from CRM [id number]. Topic: Clinical - Medication Refill >> Feb 23, 2024 12:10 PM Chiquita SQUIBB wrote: Medication: escitalopram  escitalopram  (LEXAPRO ) 10 MG tablet   Has the patient contacted their pharmacy? Yes (Agent: If no, request that the patient contact the pharmacy for the refill. If patient does not wish to contact the pharmacy document the reason why and proceed with request.) (Agent: If yes, when and what did the pharmacy advise?)  This is the patient's preferred pharmacy:   [id number], [place] - [address] [place] [address] Phone: [phone] Fax: [phone]  Is this the correct pharmacy for this prescription? Yes If no, delete pharmacy and type the correct one.   Has the prescription been filled recently? No  Is the patient out of the medication? Yes  Has the patient been seen for an appointment in the last year OR does the patient have an upcoming appointment? Yes  Can we respond through MyChart? Yes  Agent: Please be advised that Rx refills may take up to 3 business days. We ask that you follow-up with your pharmacy.

## 2024-05-29 ENCOUNTER — Telehealth: Payer: Self-pay | Admitting: Family Medicine

## 2024-05-29 NOTE — Telephone Encounter (Signed)
 Please see patient message and advise on virtual appt.    Copied from CRM [id number]. 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 He said he needed documentation of his vaccinations as well. I offered him an appointment, but [place] could possibly be [place], and wanted to know how he would be able to get his paper work filled out by Dr. Katrinka. LOV was 08/20250. Patient is requesting a call back and can be reached at [phone].

## 2024-05-29 NOTE — Telephone Encounter (Signed)
 It really depends on extent of paperwork-I have not seen the paperwork yet  As for a virtual visit if needed that would be if his insurance still covers that-I know some insurances have changed but most probably insurance appears to be covering

## 2024-05-30 NOTE — Telephone Encounter (Signed)
 Unfortunately, we can not do virtual visits out of the state of [place].

## 2024-06-05 NOTE — Telephone Encounter (Signed)
 Forms completed by Dr. Katrinka and on CMA desk.
